# Patient Record
Sex: Female | Born: 1965 | Race: Black or African American | Hispanic: No | Marital: Single | Smoking: Former smoker
Health system: Southern US, Community
[De-identification: ages and names within clinical notes are randomized; demographics above are authoritative.]

## PROBLEM LIST (undated history)

## (undated) DIAGNOSIS — H543 Unqualified visual loss, both eyes: Secondary | ICD-10-CM

## (undated) DIAGNOSIS — J45909 Unspecified asthma, uncomplicated: Secondary | ICD-10-CM

## (undated) DIAGNOSIS — E119 Type 2 diabetes mellitus without complications: Secondary | ICD-10-CM

## (undated) DIAGNOSIS — N289 Disorder of kidney and ureter, unspecified: Secondary | ICD-10-CM

## (undated) HISTORY — PX: AV FISTULA PLACEMENT: SHX1204

## (undated) HISTORY — PX: OTHER SURGICAL HISTORY: SHX169

---

## 2013-12-30 DIAGNOSIS — I10 Essential (primary) hypertension: Secondary | ICD-10-CM | POA: Diagnosis present

## 2013-12-31 DIAGNOSIS — E1149 Type 2 diabetes mellitus with other diabetic neurological complication: Secondary | ICD-10-CM | POA: Diagnosis present

## 2015-01-25 DIAGNOSIS — E782 Mixed hyperlipidemia: Secondary | ICD-10-CM | POA: Insufficient documentation

## 2016-01-08 DIAGNOSIS — I5022 Chronic systolic (congestive) heart failure: Secondary | ICD-10-CM | POA: Diagnosis present

## 2016-01-08 DIAGNOSIS — E1139 Type 2 diabetes mellitus with other diabetic ophthalmic complication: Secondary | ICD-10-CM | POA: Diagnosis present

## 2016-12-04 DIAGNOSIS — I447 Left bundle-branch block, unspecified: Secondary | ICD-10-CM | POA: Insufficient documentation

## 2017-01-13 DIAGNOSIS — D689 Coagulation defect, unspecified: Secondary | ICD-10-CM | POA: Insufficient documentation

## 2017-12-02 DIAGNOSIS — N186 End stage renal disease: Secondary | ICD-10-CM

## 2017-12-02 DIAGNOSIS — Z992 Dependence on renal dialysis: Secondary | ICD-10-CM

## 2018-02-10 DIAGNOSIS — Z6841 Body Mass Index (BMI) 40.0 and over, adult: Secondary | ICD-10-CM | POA: Insufficient documentation

## 2018-04-29 DIAGNOSIS — E875 Hyperkalemia: Secondary | ICD-10-CM | POA: Insufficient documentation

## 2018-10-28 DIAGNOSIS — Z86718 Personal history of other venous thrombosis and embolism: Secondary | ICD-10-CM

## 2020-10-15 ENCOUNTER — Encounter (HOSPITAL_COMMUNITY): Payer: Self-pay | Admitting: Emergency Medicine

## 2020-10-15 ENCOUNTER — Other Ambulatory Visit: Payer: Self-pay

## 2020-10-15 ENCOUNTER — Inpatient Hospital Stay (HOSPITAL_COMMUNITY)
Admission: EM | Admit: 2020-10-15 | Discharge: 2020-10-19 | DRG: 640 | Disposition: A | Discharge status: Home / Self Care | Payer: Medicare HMO | Attending: Family Medicine | Admitting: Family Medicine

## 2020-10-15 ENCOUNTER — Emergency Department (HOSPITAL_COMMUNITY): Payer: Medicare HMO

## 2020-10-15 DIAGNOSIS — R079 Chest pain, unspecified: Secondary | ICD-10-CM | POA: Diagnosis not present

## 2020-10-15 DIAGNOSIS — Z91041 Radiographic dye allergy status: Secondary | ICD-10-CM

## 2020-10-15 DIAGNOSIS — E785 Hyperlipidemia, unspecified: Secondary | ICD-10-CM | POA: Diagnosis present

## 2020-10-15 DIAGNOSIS — E1122 Type 2 diabetes mellitus with diabetic chronic kidney disease: Secondary | ICD-10-CM | POA: Diagnosis present

## 2020-10-15 DIAGNOSIS — Z86718 Personal history of other venous thrombosis and embolism: Secondary | ICD-10-CM | POA: Diagnosis not present

## 2020-10-15 DIAGNOSIS — J45909 Unspecified asthma, uncomplicated: Secondary | ICD-10-CM | POA: Diagnosis present

## 2020-10-15 DIAGNOSIS — I132 Hypertensive heart and chronic kidney disease with heart failure and with stage 5 chronic kidney disease, or end stage renal disease: Secondary | ICD-10-CM | POA: Diagnosis present

## 2020-10-15 DIAGNOSIS — Z20822 Contact with and (suspected) exposure to covid-19: Secondary | ICD-10-CM | POA: Diagnosis present

## 2020-10-15 DIAGNOSIS — E875 Hyperkalemia: Secondary | ICD-10-CM | POA: Diagnosis present

## 2020-10-15 DIAGNOSIS — E1165 Type 2 diabetes mellitus with hyperglycemia: Secondary | ICD-10-CM | POA: Diagnosis present

## 2020-10-15 DIAGNOSIS — N186 End stage renal disease: Secondary | ICD-10-CM | POA: Diagnosis present

## 2020-10-15 DIAGNOSIS — E1149 Type 2 diabetes mellitus with other diabetic neurological complication: Secondary | ICD-10-CM | POA: Diagnosis present

## 2020-10-15 DIAGNOSIS — Z7901 Long term (current) use of anticoagulants: Secondary | ICD-10-CM

## 2020-10-15 DIAGNOSIS — Z79899 Other long term (current) drug therapy: Secondary | ICD-10-CM

## 2020-10-15 DIAGNOSIS — G9341 Metabolic encephalopathy: Secondary | ICD-10-CM | POA: Diagnosis not present

## 2020-10-15 DIAGNOSIS — E1151 Type 2 diabetes mellitus with diabetic peripheral angiopathy without gangrene: Secondary | ICD-10-CM | POA: Diagnosis present

## 2020-10-15 DIAGNOSIS — E8770 Fluid overload, unspecified: Principal | ICD-10-CM | POA: Diagnosis present

## 2020-10-15 DIAGNOSIS — R0789 Other chest pain: Secondary | ICD-10-CM | POA: Diagnosis not present

## 2020-10-15 DIAGNOSIS — Z992 Dependence on renal dialysis: Secondary | ICD-10-CM | POA: Diagnosis not present

## 2020-10-15 DIAGNOSIS — Z888 Allergy status to other drugs, medicaments and biological substances status: Secondary | ICD-10-CM

## 2020-10-15 DIAGNOSIS — Z6841 Body Mass Index (BMI) 40.0 and over, adult: Secondary | ICD-10-CM | POA: Diagnosis not present

## 2020-10-15 DIAGNOSIS — Z91013 Allergy to seafood: Secondary | ICD-10-CM

## 2020-10-15 DIAGNOSIS — D631 Anemia in chronic kidney disease: Secondary | ICD-10-CM | POA: Diagnosis present

## 2020-10-15 DIAGNOSIS — Z9115 Patient's noncompliance with renal dialysis: Secondary | ICD-10-CM | POA: Diagnosis not present

## 2020-10-15 DIAGNOSIS — Z87891 Personal history of nicotine dependence: Secondary | ICD-10-CM

## 2020-10-15 DIAGNOSIS — E1139 Type 2 diabetes mellitus with other diabetic ophthalmic complication: Secondary | ICD-10-CM | POA: Diagnosis present

## 2020-10-15 DIAGNOSIS — Z794 Long term (current) use of insulin: Secondary | ICD-10-CM

## 2020-10-15 DIAGNOSIS — I5022 Chronic systolic (congestive) heart failure: Secondary | ICD-10-CM | POA: Diagnosis present

## 2020-10-15 DIAGNOSIS — H547 Unspecified visual loss: Secondary | ICD-10-CM | POA: Diagnosis present

## 2020-10-15 DIAGNOSIS — I953 Hypotension of hemodialysis: Secondary | ICD-10-CM | POA: Diagnosis present

## 2020-10-15 DIAGNOSIS — I10 Essential (primary) hypertension: Secondary | ICD-10-CM | POA: Diagnosis present

## 2020-10-15 DIAGNOSIS — Z89511 Acquired absence of right leg below knee: Secondary | ICD-10-CM | POA: Diagnosis not present

## 2020-10-15 DIAGNOSIS — I447 Left bundle-branch block, unspecified: Secondary | ICD-10-CM | POA: Diagnosis present

## 2020-10-15 DIAGNOSIS — I639 Cerebral infarction, unspecified: Secondary | ICD-10-CM | POA: Insufficient documentation

## 2020-10-15 HISTORY — DX: Type 2 diabetes mellitus without complications: E11.9

## 2020-10-15 HISTORY — DX: Unqualified visual loss, both eyes: H54.3

## 2020-10-15 HISTORY — DX: Disorder of kidney and ureter, unspecified: N28.9

## 2020-10-15 HISTORY — DX: Unspecified asthma, uncomplicated: J45.909

## 2020-10-15 LAB — BASIC METABOLIC PANEL
Anion gap: 15 (ref 5–15)
BUN: 103 mg/dL — ABNORMAL HIGH (ref 6–20)
CO2: 24 mmol/L (ref 22–32)
Calcium: 8.8 mg/dL — ABNORMAL LOW (ref 8.9–10.3)
Chloride: 94 mmol/L — ABNORMAL LOW (ref 98–111)
Creatinine, Ser: 12.76 mg/dL — ABNORMAL HIGH (ref 0.44–1.00)
GFR, Estimated: 3 mL/min — ABNORMAL LOW (ref >60)
Glucose, Bld: 287 mg/dL — ABNORMAL HIGH (ref 70–99)
Potassium: 6 mmol/L — ABNORMAL HIGH (ref 3.5–5.1)
Sodium: 133 mmol/L — ABNORMAL LOW (ref 135–145)

## 2020-10-15 LAB — CBC
HCT: 29.6 % — ABNORMAL LOW (ref 36.0–46.0)
Hemoglobin: 10 g/dL — ABNORMAL LOW (ref 12.0–15.0)
MCH: 29.9 pg (ref 26.0–34.0)
MCHC: 33.8 g/dL (ref 30.0–36.0)
MCV: 88.4 fL (ref 80.0–100.0)
Platelets: 257 10*3/uL (ref 150–400)
RBC: 3.35 MIL/uL — ABNORMAL LOW (ref 3.87–5.11)
RDW: 15.2 % (ref 11.5–15.5)
WBC: 9.7 10*3/uL (ref 4.0–10.5)
nRBC: 0 % (ref 0.0–0.2)

## 2020-10-15 LAB — PROTIME-INR
INR: 1.6 — ABNORMAL HIGH (ref 0.8–1.2)
Prothrombin Time: 19.1 seconds — ABNORMAL HIGH (ref 11.4–15.2)

## 2020-10-15 LAB — GLUCOSE, CAPILLARY: Glucose-Capillary: 218 mg/dL — ABNORMAL HIGH (ref 70–99)

## 2020-10-15 LAB — TROPONIN I (HIGH SENSITIVITY)
Troponin I (High Sensitivity): 18 ng/L — ABNORMAL HIGH (ref <18)
Troponin I (High Sensitivity): 18 ng/L — ABNORMAL HIGH (ref <18)

## 2020-10-15 LAB — RESP PANEL BY RT-PCR (FLU A&B, COVID) ARPGX2
Influenza A by PCR: NEGATIVE
Influenza B by PCR: NEGATIVE
SARS Coronavirus 2 by RT PCR: NEGATIVE

## 2020-10-15 MED ORDER — WARFARIN SODIUM 7.5 MG PO TABS: 3.5000 mg | ORAL_TABLET | Freq: Every day | ORAL | Status: DC

## 2020-10-15 MED ORDER — WARFARIN - PHARMACIST DOSING INPATIENT: Freq: Every day | Status: DC

## 2020-10-15 MED ORDER — WARFARIN SODIUM 7.5 MG PO TABS: 7.5000 mg | ORAL_TABLET | Freq: Once | ORAL | Status: DC

## 2020-10-15 MED ORDER — ALBUTEROL SULFATE (2.5 MG/3ML) 0.083% IN NEBU: 3.0000 mL | INHALATION_SOLUTION | RESPIRATORY_TRACT | Status: DC | PRN

## 2020-10-15 MED ORDER — WARFARIN SODIUM 7.5 MG PO TABS
7.5000 mg | ORAL_TABLET | Freq: Once | ORAL | Status: AC
  Administered 2020-10-15: 7.5 mg via ORAL
  Filled 2020-10-15: qty 1

## 2020-10-15 MED ORDER — ONDANSETRON HCL 4 MG PO TABS: 4.0000 mg | ORAL_TABLET | Freq: Four times a day (QID) | ORAL | Status: DC | PRN

## 2020-10-15 MED ORDER — INSULIN ASPART 100 UNIT/ML IJ SOLN
0.0000 [IU] | Freq: Every day | INTRAMUSCULAR | Status: DC
  Administered 2020-10-15 – 2020-10-17 (×2): 2 [IU] via SUBCUTANEOUS

## 2020-10-15 MED ORDER — INSULIN DETEMIR 100 UNIT/ML ~~LOC~~ SOLN
14.0000 [IU] | Freq: Every day | SUBCUTANEOUS | Status: DC
  Administered 2020-10-15: 14 [IU] via SUBCUTANEOUS
  Filled 2020-10-15 (×2): qty 0.14

## 2020-10-15 MED ORDER — ONDANSETRON HCL 4 MG/2ML IJ SOLN: 4.0000 mg | Freq: Four times a day (QID) | INTRAMUSCULAR | Status: DC | PRN

## 2020-10-15 MED ORDER — SODIUM ZIRCONIUM CYCLOSILICATE 5 G PO PACK
10.0000 g | PACK | Freq: Once | ORAL | Status: AC
  Administered 2020-10-15: 10 g via ORAL
  Filled 2020-10-15: qty 2

## 2020-10-15 MED ORDER — CHLORHEXIDINE GLUCONATE CLOTH 2 % EX PADS
6.0000 | MEDICATED_PAD | Freq: Every day | CUTANEOUS | Status: DC
  Administered 2020-10-16 – 2020-10-18 (×2): 6 via TOPICAL

## 2020-10-15 MED ORDER — PANTOPRAZOLE SODIUM 40 MG PO TBEC
40.0000 mg | DELAYED_RELEASE_TABLET | Freq: Every day | ORAL | Status: DC
  Administered 2020-10-15 – 2020-10-18 (×4): 40 mg via ORAL
  Filled 2020-10-15 (×4): qty 1

## 2020-10-15 MED ORDER — INSULIN ASPART 100 UNIT/ML IJ SOLN: 8.0000 [IU] | Freq: Three times a day (TID) | INTRAMUSCULAR | Status: DC

## 2020-10-15 MED ORDER — ROSUVASTATIN CALCIUM 20 MG PO TABS
20.0000 mg | ORAL_TABLET | ORAL | Status: DC
  Administered 2020-10-16: 20 mg via ORAL
  Filled 2020-10-15 (×2): qty 1

## 2020-10-15 MED ORDER — INSULIN ASPART 100 UNIT/ML IJ SOLN
0.0000 [IU] | Freq: Three times a day (TID) | INTRAMUSCULAR | Status: DC
  Administered 2020-10-16: 3 [IU] via SUBCUTANEOUS
  Administered 2020-10-16: 8 [IU] via SUBCUTANEOUS
  Administered 2020-10-17 (×2): 2 [IU] via SUBCUTANEOUS
  Administered 2020-10-17: 3 [IU] via SUBCUTANEOUS
  Administered 2020-10-18: 2 [IU] via SUBCUTANEOUS
  Administered 2020-10-18 (×2): 3 [IU] via SUBCUTANEOUS

## 2020-10-15 MED ORDER — CALCIUM GLUCONATE-NACL 1-0.675 GM/50ML-% IV SOLN
1.0000 g | Freq: Once | INTRAVENOUS | Status: AC
  Administered 2020-10-15: 1000 mg via INTRAVENOUS
  Filled 2020-10-15: qty 50

## 2020-10-15 MED ORDER — SEVELAMER CARBONATE 800 MG PO TABS
3200.0000 mg | ORAL_TABLET | Freq: Three times a day (TID) | ORAL | Status: DC
  Administered 2020-10-17 – 2020-10-18 (×5): 3200 mg via ORAL
  Filled 2020-10-15 (×6): qty 4

## 2020-10-15 NOTE — Progress Notes (Signed)
ANTICOAGULATION CONSULT NOTE - Initial Up Consult   Pharmacy Consult for warfarin dosing  Indication: VTE prophylaxis    Allergies  Allergen Reactions   Eggs Or Egg-Derived Products Hives   Iodinated Diagnostic Agents Hives, Shortness Of Breath and Nausea And Vomiting    IVP   Shellfish Allergy Hives and Nausea And Vomiting    "LOBSTER ONLY" Lobster    Dulaglutide Nausea And Vomiting   Gabapentin Other (See Comments)    Other reaction(s): Delirium    Lorazepam Other (See Comments)    Other reaction(s): Hallucination       Patient Measurements: Last Weight  Most recent update: 10/15/2020  7:05 PM    Weight  118.1 kg (260 lb 5.8 oz)            Body mass index is 44.69 kg/m. March Rummage               Temp: 98.1 F (36.7 C) (07/03 1850) Temp Source: Oral (07/03 1850) BP: 134/84 (07/03 1850) Pulse Rate: 100 (07/03 1850)  Labs: Recent Labs    10/15/20 1510  HGB 10.0*  HCT 29.6*  PLT 257  LABPROT 19.1*  INR 1.6*  CREATININE 12.76*    Estimated Creatinine Clearance: 6.2 mL/min (A) (by C-G formula based on SCr of 12.76 mg/dL (H)).     Medications:  Medications Prior to Admission  Medication Sig Dispense Refill Last Dose   albuterol (PROVENTIL) (5 MG/ML) 0.5% nebulizer solution Take 2.5 mg by nebulization every 6 (six) hours as needed for wheezing or shortness of breath.   PRN   albuterol (VENTOLIN HFA) 108 (90 Base) MCG/ACT inhaler Inhale 2 puffs into the lungs every 4 (four) hours as needed for wheezing or shortness of breath.   10/15/2020   Cholecalciferol 25 MCG (1000 UT) tablet Take 1,000 Units by mouth daily. Takes on Tues, Thurs,Sat   10/10/2020   insulin aspart (NOVOLOG) 100 UNIT/ML injection Inject 8 Units into the skin 3 (three) times daily before meals.   10/14/2020   insulin detemir (LEVEMIR) 100 UNIT/ML injection Inject 14 Units into the skin at bedtime.   10/14/2020   lidocaine-prilocaine (EMLA) cream Apply 1 application topically daily. Takes on Tues,  Thurs, Sat   10/10/2020   pantoprazole (PROTONIX) 40 MG tablet Take 40 mg by mouth daily.   10/14/2020   rosuvastatin (CRESTOR) 20 MG tablet Take 20 mg by mouth every 7 (seven) days.   Past Week   sevelamer carbonate (RENVELA) 800 MG tablet Take 4 tablets by mouth 3 (three) times daily.   10/14/2020   warfarin (COUMADIN) 7.5 MG tablet Take 3.5-7.5 mg by mouth daily. 3.5 mg daily T,R,S,S 7.5 mg daily M,W,F   10/14/2020 at 1000   Scheduled:   [START ON 10/16/2020] Chlorhexidine Gluconate Cloth  6 each Topical Q0600   insulin aspart  0-15 Units Subcutaneous TID WC   insulin aspart  0-5 Units Subcutaneous QHS   insulin aspart  8 Units Subcutaneous TID AC   insulin detemir  14 Units Subcutaneous QHS   pantoprazole  40 mg Oral Daily   [START ON 10/16/2020] rosuvastatin  20 mg Oral Q7 days   sevelamer carbonate  3,200 mg Oral TID with meals   warfarin  3.75-7.5 mg Oral Daily   Infusions:   calcium gluconate     PRN: albuterol, ondansetron **OR** ondansetron (ZOFRAN) IV Anti-infectives (From admission, onward)    None       Goal of Therapy:  INR 2-3 Monitor platelets by  anticoagulation protocol: Yes    Prior to Admission Warfarin Dosing:  Hermoine Frevert takes 7.'5mg'$  of warfarin MWF and 3.'5mg'$  qTuThSaSun     Admit INR was 1.6 Lab Results  Component Value Date   INR 1.6 (H) 10/15/2020    Assessment: Stacey Harrell a 56 y.o. female requires anticoagulation with warfarin for the indication of  VTE prophylaxis. Warfarin will be initiated inpatient following pharmacy protocol per pharmacy consult. Patient most recent blood work is as follows: CBC Latest Ref Rng & Units 10/15/2020  WBC 4.0 - 10.5 K/uL 9.7  Hemoglobin 12.0 - 15.0 g/dL 10.0(L)  Hematocrit 36.0 - 46.0 % 29.6(L)  Platelets 150 - 400 K/uL 257     Plan: Warfarin 7.'5mg'$  po x 1  Monitor CBC daily with am labs   Monitor INR daily Monitor for signs and symptoms of bleeding   Donna Christen Heyden Jaber, PharmD, MBA, BCGP Clinical  Pharmacist

## 2020-10-15 NOTE — Progress Notes (Signed)
Pt arrived to room 325 via stretcher from ED. Pt transferred from stretcher to bed with 3 assists.  Pt states only makes small amount of urine infrequently, states will use bedpan when she needs to void. Call bell within reach, boyfriend at bedside. Tele on, SR 90's. Other VSS. O2 @ 2 lpm Sharon on.  Oriented to safety procedures, states understanding.

## 2020-10-15 NOTE — ED Provider Notes (Signed)
[facility] EMERGENCY DEPARTMENT Provider Note   CSN: [id number] Arrival date & time: 10/15/20  1427     History Chief Complaint  Patient presents with   Chest Pain    Stacey Harrell is a 55 y.o. female.  She has a history of end-stage renal disease and last had dialysis on 6/28.  Also has a history of hypertension, CHF, peripheral vascular disease, DM. she has moved to [place] but has not establish care with [facility] yet.  She is done the paperwork and had to get a TB test but has not heard back from them.  Complaining of chest tightness and shortness of breath.  Symptoms typical for her when she has missed dialysis.  She denies any abdominal pain vomiting or diarrhea.  The history is provided by the patient.  Shortness of Breath Severity:  Moderate Onset quality:  Gradual Duration:  5 days Timing:  Constant Progression:  Worsening Chronicity:  Recurrent Context: activity   Relieved by:  Nothing Worsened by:  Activity Ineffective treatments:  None tried Associated symptoms: chest pain   Associated symptoms: no abdominal pain, no cough, no fever, no headaches, no neck pain, no rash, no sore throat and no vomiting       Past Medical History:  Diagnosis Date   Asthma    Blindness, bilateral    Diabetes mellitus without complication ([place])    Renal disorder     There are no problems to display for this patient.   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     History reviewed. No pertinent family history.  Social History   Tobacco Use   Smoking status: Former    Years: 40.00    Pack years: 0.00    Types: Cigarettes   Smokeless tobacco: Never  Vaping Use   Vaping Use: Never used  Substance Use Topics   Alcohol use: Not Currently   Drug use: Not Currently    Home Medications Prior to Admission medications   Not on File     Allergies    Iodinated diagnostic agents, Other, Shellfish allergy, Dulaglutide, Eggs or egg-derived products, Gabapentin, and Lorazepam  Review of Systems   Review of Systems  Constitutional:  Negative for fever.  HENT:  Negative for sore throat.   Eyes:  Positive for visual disturbance (chronic).  Respiratory:  Positive for shortness of breath. Negative for cough.   Cardiovascular:  Positive for chest pain.  Gastrointestinal:  Negative for abdominal pain and vomiting.  Genitourinary:  Negative for vaginal bleeding.  Musculoskeletal:  Negative for neck pain.  Skin:  Negative for rash.  Neurological:  Negative for headaches.   Physical Exam Updated Vital Signs BP (!) 145/83   Pulse (!) 105   Temp 98.4 F (36.9 C) (Oral)   Resp 18   Ht '5\' 4"'$  (1.626 m)   Wt 115.2 kg   SpO2 93%   BMI 43.60 kg/m   Physical Exam Vitals and nursing note reviewed.  Constitutional:      General: She is not in acute distress.    Appearance: Normal appearance. She is well-developed. She is obese.  HENT:     Head: Normocephalic and atraumatic.  Eyes:     Conjunctiva/sclera: Conjunctivae normal.  Cardiovascular:     Rate and Rhythm: Regular rhythm. Tachycardia  present.     Heart sounds: No murmur heard. Pulmonary:     Effort: Pulmonary effort is normal. No respiratory distress.     Breath sounds: Normal breath sounds. No stridor. No wheezing.  Abdominal:     Palpations: Abdomen is soft.     Tenderness: There is no abdominal tenderness.  Musculoskeletal:        General: No tenderness. Normal range of motion.     Cervical back: Neck supple.     Comments: She is to the right upper arm.  She has a BKA lower extremity  Skin:    General: Skin is warm and dry.  Neurological:     General: No focal deficit present.     Mental Status: She is alert.     GCS: GCS eye subscore is 4. GCS verbal subscore is 5. GCS motor subscore is 6.    ED Results / Procedures / Treatments   Labs (all labs  ordered are listed, but only abnormal results are displayed) Labs Reviewed  BASIC METABOLIC PANEL - Abnormal; Notable for the following components:      Result Value   Sodium 133 (*)    Potassium 6.0 (*)    Chloride 94 (*)    Glucose, Bld 287 (*)    BUN 103 (*)    Creatinine, Ser 12.76 (*)    Calcium 8.8 (*)    GFR, Estimated 3 (*)    All other components within normal limits  CBC - Abnormal; Notable for the following components:   RBC 3.35 (*)    Hemoglobin 10.0 (*)    HCT 29.6 (*)    All other components within normal limits  PROTIME-INR - Abnormal; Notable for the following components:   Prothrombin Time 19.1 (*)    INR 1.6 (*)    All other components within normal limits  TROPONIN I (HIGH SENSITIVITY) - Abnormal; Notable for the following components:   Troponin I (High Sensitivity) 18 (*)    All other components within normal limits  RESP PANEL BY RT-PCR (FLU A&B, COVID) [id number]    EKG EKG Interpretation  Date/Time:  Sunday October 15 2020 15:06:31 EDT Ventricular Rate:  102 PR Interval:  152 QRS Duration: 148 QT Interval:  422 QTC Calculation: 550 R Axis:   107 Text Interpretation: Sinus tachycardia Rightward axis Non-specific intra-ventricular conduction block Abnormal ECG No old tracing to compare Confirmed by Aletta Edouard [phone]) on 10/15/2020 3:26:14 PM  Radiology DG Chest 2 View  Result Date: 10/15/2020 CLINICAL DATA:  Chest pain and shortness of breath. EXAM: CHEST - 2 VIEW COMPARISON:  None. FINDINGS: The heart is enlarged. There is a vascular stent to the right of the mediastinum. Surgical clips adjacent to the right lung apex. Peribronchial thickening may be bronchitic or congestive minimal fluid in the fissures without significant sub pulmonic effusion. No confluent consolidation. No pneumothorax. No acute osseous abnormalities are seen. IMPRESSION: 1. Cardiomegaly. 2. Peribronchial thickening may be bronchitic or congestive. Fluid in the fissures. Findings  suggest fluid overload. Electronically Signed   By: Keith Rake M.D.   On: 10/15/2020 15:37    Procedures .Critical Care  Date/Time: 10/16/2020 10:10 AM Performed by: Hayden Rasmussen, MD Authorized by: Hayden Rasmussen, MD   Critical care provider statement:    Critical care time (minutes):  45   Critical care time was exclusive of:  Separately billable procedures and treating other patients   Critical care was necessary to treat or prevent imminent or life-threatening  deterioration of the following conditions:  Metabolic crisis   Critical care was time spent personally by me on the following activities:  Discussions with consultants, evaluation of patient's response to treatment, examination of patient, ordering and performing treatments and interventions, ordering and review of laboratory studies, ordering and review of radiographic studies, pulse oximetry, re-evaluation of patient's condition, obtaining history from patient or surrogate, review of old charts and development of treatment plan with patient or surrogate   Medications Ordered in ED Medications  Chlorhexidine Gluconate Cloth 2 % PADS 6 each (6 each Topical Given 10/16/20 0601)  rosuvastatin (CRESTOR) tablet 20 mg (has no administration in time range)  insulin detemir (LEVEMIR) injection 14 Units (14 Units Subcutaneous Given 10/15/20 2111)  pantoprazole (PROTONIX) EC tablet 40 mg (40 mg Oral Given 10/16/20 0805)  sevelamer carbonate (RENVELA) tablet 3,200 mg (3,200 mg Oral Not Given 10/16/20 0805)  albuterol (PROVENTIL) (2.5 MG/3ML) 0.083% nebulizer solution 3 mL (has no administration in time range)  insulin aspart (novoLOG) injection 0-15 Units (3 Units Subcutaneous Given 10/16/20 0806)  insulin aspart (novoLOG) injection 0-5 Units (2 Units Subcutaneous Given 10/15/20 2114)  ondansetron (ZOFRAN) tablet 4 mg (has no administration in time range)    Or  ondansetron (ZOFRAN) injection 4 mg (has no administration in time range)   Warfarin - Pharmacist Dosing Inpatient ( Does not apply Given 10/15/20 2239)  warfarin (COUMADIN) tablet 7.5 mg (has no administration in time range)  sodium zirconium cyclosilicate (LOKELMA) packet 10 g (10 g Oral Given 10/15/20 1733)  calcium gluconate 1 g/ 50 mL sodium chloride IVPB (0 mg Intravenous Stopped 10/15/20 2150)  warfarin (COUMADIN) tablet 7.5 mg (7.5 mg Oral Given 10/15/20 2239)    ED Course  I have reviewed the triage vital signs and the nursing notes.  Pertinent labs & imaging results that were available during my care of the patient were reviewed by me and considered in my medical decision making (see chart for details).  Clinical Course as of 10/16/20 1007  Sun Oct 15, 2020  1638 Discussed with nephrology Dr. Delena Bali.  He is recommending hospitalist admission, Lokelma, renal diet and will arrange for her dialysis tomorrow. [MB]  1638 Discussed with Dr. Nehemiah Settle from [facility] hospitalist who will evaluate the patient for admission. [MB]    Clinical Course User Index [MB] Hayden Rasmussen, MD   MDM Rules/Calculators/A&P                         This patient complains of chest pain and shortness of breath, needing dialysis; this involves an extensive number of treatment Options and is a complaint that carries with it a high risk of complications and Morbidity. The differential includes CHF, fluid overload, metabolic derangement, hyperkalemia, arrhythmia, ACS  I ordered, reviewed and interpreted labs, which included CBC with normal white count, hemoglobin low with no priors to compare with, chemistry showing elevated BUN/creatinine consistent with her renal failure, elevated potassium, troponins mildly elevated will need to be trended, INR subtherapeutic I ordered medication oral Lokelma for hyperkalemia I ordered imaging studies which included chest x-ray and I independently    visualized and interpreted imaging which showed cardiomegaly and some signs of fluid overload Additional  history obtained from patient significant other Previous records obtained and reviewed in epic, no recent visits in our system I consulted Dr. Jonnie Finner nephrology and Dr. Nehemiah Settle [facility] and discussed lab and imaging findings  Critical Interventions: Work-up and management of patient's end-stage renal  disease with hyperkalemia with interventions of lokelma  After the interventions stated above, I reevaluated the patient and found patient to be fairly asymptomatic.  She is in agreement for admission to the hospital for further management and dialysis in the morning   Final Clinical Impression(s) / ED Diagnoses Final diagnoses:  Hyperkalemia  ESRD on dialysis [facility])  Nonspecific chest pain    Rx / DC Orders ED Discharge Orders     None        Hayden Rasmussen, MD 10/16/20 1013

## 2020-10-15 NOTE — ED Triage Notes (Signed)
Pt to the ED asking for emergent dialysis.  Pt recently moved from [place] to [place] and has not been set up with [facility] yet.  Pt last had dialysis on Tuesday 10/10/20.  Pt c/o chest tightness with shortness of breath that began today.

## 2020-10-15 NOTE — H&P (Addendum)
History and Physical  Stacey Harrell [id number] DOB: [birth year] DOA: 10/15/2020  Referring physician: Dr Melina Copa, ED physician PCP: Pcp, No  Outpatient Specialists:   Patient Coming From: home  Chief Complaint: shortness of breath, chest pressure  HPI: Stacey Harrell is a 55 y.o. 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 She has ESRD on dialysis. She moved to [place], but does not have a nephrologist there and has not had dialysis for 3 days. She started having chest pressure and shortness of breath this morning.  Worse with movement and improved with rest.  No radiation of chest pressure.  Symptoms are staying the same.  Emergency Department Course: Potassium 6.0.  Troponins 18 and stable.  White count normal.  Slightly anemic at 10.0.  INR 1.6.  COVID-negative.  EDP discussed with nephrology: No increased oxygen demand and patient will get dialysis tomorrow.  Dose of Lokelma was given.  Review of Systems:   Pt denies any fevers, chills, nausea, vomiting, diarrhea, constipation, abdominal pain, palpitations, headache, vision changes, lightheadedness, dizziness, melena, rectal bleeding.  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 Her smoking use included cigarettes. She has never used smokeless tobacco. She reports previous alcohol use. She reports previous drug use. Patient lives at home  Allergies  Allergen Reactions   Eggs Or Egg-Derived Products Hives   Iodinated Diagnostic Agents Hives, Shortness Of Breath and Nausea And Vomiting    IVP   Shellfish Allergy Hives and Nausea  And Vomiting    "LOBSTER ONLY" Lobster    Dulaglutide Nausea And Vomiting   Gabapentin Other (See Comments)    Other reaction(s): Delirium    Lorazepam Other (See Comments)    Other reaction(s): Hallucination     History reviewed. No pertinent family history.  Family history of diabetes  Prior to Admission medications   Medication Sig Start Date End Date Taking? Authorizing Provider  albuterol (PROVENTIL) (5 MG/ML) 0.5% nebulizer solution Take 2.5 mg by nebulization every 6 (six) hours as needed for wheezing or shortness of breath.   Yes [provider]  albuterol (VENTOLIN HFA) 108 (90 Base) MCG/ACT inhaler Inhale 2 puffs into the lungs every 4 (four) hours as needed for wheezing or shortness of breath.   Yes [provider]  Cholecalciferol 25 MCG (1000 UT) tablet Take 1,000 Units by mouth daily. Takes on Tues, Thurs,Sat   Yes [provider]  insulin aspart (NOVOLOG) 100 UNIT/ML injection Inject 8 Units into the skin 3 (three) times daily before meals.   Yes [provider]  insulin detemir (LEVEMIR) 100 UNIT/ML injection Inject 14 Units into the skin at bedtime.   Yes [provider]  lidocaine-prilocaine (EMLA) cream Apply 1 application topically daily. Takes on Tues, Thurs, Sat   Yes [provider]  pantoprazole (PROTONIX) 40 MG tablet Take 40 mg by mouth daily. 08/24/20  Yes [provider]  rosuvastatin (CRESTOR) 20 MG tablet Take 20 mg by mouth every 7 (seven) days. 07/08/19  Yes [provider]  sevelamer carbonate (RENVELA) 800 MG  tablet Take 4 tablets by mouth 3 (three) times daily. 09/25/20  Yes [provider]  warfarin (COUMADIN) 7.5 MG tablet Take 3.5-7.5 mg by mouth daily. 3.5 mg daily T,R,S,S 7.5 mg daily M,W,F   Yes [provider]    Physical Exam: BP 140/89   Pulse 99   Temp 98.4 F (36.9 C) (Oral)   Resp (!) 22   Ht '5\' 4"'$  (1.626 m)   Wt 115.2 kg   SpO2 99%   BMI 43.60  kg/m   General: Middle-age female. Awake and alert and oriented x3. No acute cardiopulmonary distress.  HEENT: Normocephalic atraumatic.  Right and left ears normal in appearance.  Pupils equal, round, reactive to light. Extraocular muscles are intact. Sclerae anicteric and noninjected.  Moist mucosal membranes. No mucosal lesions.  Neck: Neck supple without lymphadenopathy. No carotid bruits. No masses palpated.  Cardiovascular: Regular rate with normal S1-S2 sounds. No murmurs, rubs, gallops auscultated. No JVD.  Respiratory: Mild rales throughout.  Lungs clear to auscultation bilaterally.  No accessory muscle use. Abdomen: Soft, nontender, nondistended. Active bowel sounds. No masses or hepatosplenomegaly  Skin: No rashes, lesions, or ulcerations.  Dry, warm to touch. 2+ dorsalis pedis and radial pulses. Musculoskeletal: Right BKA.  No calf or leg pain. All major joints not erythematous nontender.  No upper or lower joint deformation.  Good ROM.  No contractures  Psychiatric: Intact judgment and insight. Pleasant and cooperative. Neurologic: No focal neurological deficits. Strength is 5/5 and symmetric in upper and lower extremities.  Cranial nerves II through XII are grossly intact.           Labs on Admission: I have personally reviewed following labs and imaging studies  CBC: Recent Labs  Lab 10/15/20 1510  WBC 9.7  HGB 10.0*  HCT 29.6*  MCV 88.4  PLT 99991111   Basic Metabolic Panel: Recent Labs  Lab 10/15/20 1510  NA 133*  K 6.0*  CL 94*  CO2 24  GLUCOSE 287*  BUN 103*  CREATININE 12.76*  CALCIUM 8.8*   GFR: Estimated Creatinine Clearance: 6.1 mL/min (A) (by C-G formula based on SCr of 12.76 mg/dL (H)). Liver Function Tests: No results for input(s): AST, ALT, ALKPHOS, BILITOT, PROT, ALBUMIN in the last 168 hours. No results for input(s): LIPASE, AMYLASE in the last 168 hours. No results for input(s): AMMONIA in the last 168 hours. Coagulation Profile: Recent Labs   Lab 10/15/20 1510  INR 1.6*   Cardiac Enzymes: No results for input(s): CKTOTAL, CKMB, CKMBINDEX, TROPONINI in the last 168 hours. BNP (last 3 results) No results for input(s): PROBNP in the last 8760 hours. HbA1C: No results for input(s): HGBA1C in the last 72 hours. CBG: No results for input(s): GLUCAP in the last 168 hours. Lipid Profile: No results for input(s): CHOL, HDL, LDLCALC, TRIG, CHOLHDL, LDLDIRECT in the last 72 hours. Thyroid Function Tests: No results for input(s): TSH, T4TOTAL, FREET4, T3FREE, THYROIDAB in the last 72 hours. Anemia Panel: No results for input(s): VITAMINB12, FOLATE, FERRITIN, TIBC, IRON, RETICCTPCT in the last 72 hours. Urine analysis: No results found for: COLORURINE, APPEARANCEUR, LABSPEC, PHURINE, GLUCOSEU, HGBUR, BILIRUBINUR, KETONESUR, PROTEINUR, UROBILINOGEN, NITRITE, LEUKOCYTESUR Sepsis Labs: '@LABRCNTIP'$ (procalcitonin:4,lacticidven:4) ) Recent Results (from the past 240 hour(s))  Resp Panel by RT-PCR (Flu A&B, Covid) Nasopharyngeal Swab     Status: None   Collection Time: 10/15/20  4:22 PM   Specimen: Nasopharyngeal Swab; Nasopharyngeal(NP) swabs in vial transport medium  Result Value Ref Range Status   SARS Coronavirus 2 by  RT PCR NEGATIVE NEGATIVE Final    Comment: (NOTE) SARS-CoV-2 target nucleic acids are NOT DETECTED.  The SARS-CoV-2 RNA is generally detectable in upper respiratory specimens during the acute phase of infection. The lowest concentration of SARS-CoV-2 viral copies this assay can detect is 138 copies/mL. A negative result does not preclude SARS-Cov-2 infection and should not be used as the sole basis for treatment or other patient management decisions. A negative result may occur with  improper specimen collection/handling, submission of specimen other than nasopharyngeal swab, presence of viral mutation(s) within the areas targeted by this assay, and inadequate number of viral copies(<138 copies/mL). A negative  result must be combined with clinical observations, patient history, and epidemiological information. The expected result is Negative.  Fact Sheet for Patients:  EntrepreneurPulse.com.au  Fact Sheet for Healthcare Providers:  IncredibleEmployment.be  This test is no t yet approved or cleared by the [place] FDA and  has been authorized for detection and/or diagnosis of SARS-CoV-2 by FDA under an Emergency Use Authorization (EUA). This EUA will remain  in effect (meaning this test can be used) for the duration of the COVID-19 declaration under Section 564(b)(1) of the Act, 21 U.S.C.section 360bbb-3(b)(1), unless the authorization is terminated  or revoked sooner.       Influenza A by PCR NEGATIVE NEGATIVE Final   Influenza B by PCR NEGATIVE NEGATIVE Final    Comment: (NOTE) The Xpert Xpress SARS-CoV-2/FLU/RSV plus assay is intended as an aid in the diagnosis of influenza from Nasopharyngeal swab specimens and should not be used as a sole basis for treatment. Nasal washings and aspirates are unacceptable for Xpert Xpress SARS-CoV-2/FLU/RSV testing.  Fact Sheet for Patients: EntrepreneurPulse.com.au  Fact Sheet for Healthcare Providers: IncredibleEmployment.be  This test is not yet approved or cleared by the [place] FDA and has been authorized for detection and/or diagnosis of SARS-CoV-2 by FDA under an Emergency Use Authorization (EUA). This EUA will remain in effect (meaning this test can be used) for the duration of the COVID-19 declaration under Section 564(b)(1) of the Act, 21 U.S.C. section 360bbb-3(b)(1), unless the authorization is terminated or revoked.  Performed at [facility], [address] [place], [place] [address]      Radiological Exams on Admission: DG Chest 2 View  Result Date: 10/15/2020 CLINICAL DATA:  Chest pain and shortness of breath. EXAM: CHEST - 2 VIEW COMPARISON:   None. FINDINGS: The heart is enlarged. There is a vascular stent to the right of the mediastinum. Surgical clips adjacent to the right lung apex. Peribronchial thickening may be bronchitic or congestive minimal fluid in the fissures without significant sub pulmonic effusion. No confluent consolidation. No pneumothorax. No acute osseous abnormalities are seen. IMPRESSION: 1. Cardiomegaly. 2. Peribronchial thickening may be bronchitic or congestive. Fluid in the fissures. Findings suggest fluid overload. Electronically Signed   By: Keith Rake M.D.   On: 10/15/2020 15:37    EKG: Independently reviewed.  Sinus tachycardia with right axis deviation.  Intraventricular conduction delay.  No acute ST changes.  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  We also discussed care given  by the ED provider.  Acute hyperkalemia Lokelma given Admit to telemetry Dialysis tomorrow No particular peaked T waves, will give a dose of calcium gluconate  Fluid overload state, end-stage renal disease on hemodialysis Dialysis tomorrow Nephrology consulted  Type 2 diabetes Continue home insulin regimen Sliding scale insulin with CBGs before meals and nightly  Hypertension Continue home regimen  History of DVT Patient on Coumadin   DVT prophylaxis: Coumadin Consultants: nephrology Code Status: Full code Family Communication: Partner in the room Disposition Plan: Patient should be able to return home following improvement   Truett Mainland, DO

## 2020-10-16 DIAGNOSIS — R079 Chest pain, unspecified: Secondary | ICD-10-CM

## 2020-10-16 LAB — CBC
HCT: 31.4 % — ABNORMAL LOW (ref 36.0–46.0)
Hemoglobin: 10.5 g/dL — ABNORMAL LOW (ref 12.0–15.0)
MCH: 29.6 pg (ref 26.0–34.0)
MCHC: 33.4 g/dL (ref 30.0–36.0)
MCV: 88.5 fL (ref 80.0–100.0)
Platelets: 266 10*3/uL (ref 150–400)
RBC: 3.55 MIL/uL — ABNORMAL LOW (ref 3.87–5.11)
RDW: 14.8 % (ref 11.5–15.5)
WBC: 9.3 10*3/uL (ref 4.0–10.5)
nRBC: 0 % (ref 0.0–0.2)

## 2020-10-16 LAB — HEPATITIS B SURFACE ANTIGEN: Hepatitis B Surface Ag: NONREACTIVE

## 2020-10-16 LAB — HIV ANTIBODY (ROUTINE TESTING W REFLEX): HIV Screen 4th Generation wRfx: NONREACTIVE

## 2020-10-16 LAB — GLUCOSE, CAPILLARY
Glucose-Capillary: 184 mg/dL — ABNORMAL HIGH (ref 70–99)
Glucose-Capillary: 269 mg/dL — ABNORMAL HIGH (ref 70–99)
Glucose-Capillary: 104 mg/dL — ABNORMAL HIGH (ref 70–99)
Glucose-Capillary: 186 mg/dL — ABNORMAL HIGH (ref 70–99)

## 2020-10-16 LAB — PROTIME-INR
INR: 1.8 — ABNORMAL HIGH (ref 0.8–1.2)
Prothrombin Time: 21.2 seconds — ABNORMAL HIGH (ref 11.4–15.2)

## 2020-10-16 LAB — HEPATITIS B CORE ANTIBODY, TOTAL: Hep B Core Total Ab: NONREACTIVE

## 2020-10-16 LAB — BASIC METABOLIC PANEL
Anion gap: 16 — ABNORMAL HIGH (ref 5–15)
BUN: 103 mg/dL — ABNORMAL HIGH (ref 6–20)
CO2: 25 mmol/L (ref 22–32)
Calcium: 9 mg/dL (ref 8.9–10.3)
Chloride: 95 mmol/L — ABNORMAL LOW (ref 98–111)
Creatinine, Ser: 13.06 mg/dL — ABNORMAL HIGH (ref 0.44–1.00)
GFR, Estimated: 3 mL/min — ABNORMAL LOW (ref >60)
Glucose, Bld: 205 mg/dL — ABNORMAL HIGH (ref 70–99)
Potassium: 6.3 mmol/L (ref 3.5–5.1)
Sodium: 136 mmol/L (ref 135–145)

## 2020-10-16 MED ORDER — CHLORHEXIDINE GLUCONATE CLOTH 2 % EX PADS
6.0000 | MEDICATED_PAD | Freq: Every day | CUTANEOUS | Status: DC
  Administered 2020-10-16 – 2020-10-18 (×3): 6 via TOPICAL

## 2020-10-16 MED ORDER — WARFARIN SODIUM 7.5 MG PO TABS
7.5000 mg | ORAL_TABLET | Freq: Once | ORAL | Status: AC
  Administered 2020-10-16: 7.5 mg via ORAL
  Filled 2020-10-16: qty 1

## 2020-10-16 MED ORDER — INSULIN DETEMIR 100 UNIT/ML ~~LOC~~ SOLN
8.0000 [IU] | Freq: Every day | SUBCUTANEOUS | Status: DC
  Administered 2020-10-16 – 2020-10-18 (×3): 8 [IU] via SUBCUTANEOUS
  Filled 2020-10-16 (×4): qty 0.08

## 2020-10-16 NOTE — Procedures (Signed)
   HEMODIALYSIS TREATMENT NOTE:  3.5 hour heparin-free HD completed via right upper arm AVF (15g/antegrade).  Skin surrounding the AVF is reddened but there is no tenderness, heat, or inflammation.  Pt states, "everyone asks me about that but it's been that way for a while."    Goal NOT met:  UF was limited by hypotension.  Net UF 1 liter.  All blood was returned and hemostasis was achieved in 15 minutes.  No changes from pre-HD assessment.  For additional HD session tomorrow.  Rockwell Alexandria, RN

## 2020-10-16 NOTE — Plan of Care (Signed)

## 2020-10-16 NOTE — Progress Notes (Signed)
ANTICOAGULATION CONSULT NOTE - Initial Up Consult   Pharmacy Consult for warfarin dosing  Indication: VTE prophylaxis    Allergies  Allergen Reactions   Eggs Or Egg-Derived Products Hives   Iodinated Diagnostic Agents Hives, Shortness Of Breath and Nausea And Vomiting    IVP   Shellfish Allergy Hives and Nausea And Vomiting    "LOBSTER ONLY" Lobster    Dulaglutide Nausea And Vomiting   Gabapentin Other (See Comments)    Other reaction(s): Delirium    Lorazepam Other (See Comments)    Other reaction(s): Hallucination       Patient Measurements: Last Weight  Most recent update: 10/16/2020  8:02 AM    Weight  118.1 kg (260 lb 5.8 oz)            Body mass index is 44.69 kg/m. Stacey Harrell               Temp: 98.6 F (37 C) (07/04 0532) BP: 121/74 (07/04 0532) Pulse Rate: 98 (07/04 0532)  Labs: Recent Labs    10/15/20 1510 10/16/20 0649  HGB 10.0* 10.5*  HCT 29.6* 31.4*  PLT 257 266  LABPROT 19.1* 21.2*  INR 1.6* 1.8*  CREATININE 12.76* 13.06*     Estimated Creatinine Clearance: 6.1 mL/min (A) (by C-G formula based on SCr of 13.06 mg/dL (H)).     Medications:  Medications Prior to Admission  Medication Sig Dispense Refill Last Dose   albuterol (PROVENTIL) (5 MG/ML) 0.5% nebulizer solution Take 2.5 mg by nebulization every 6 (six) hours as needed for wheezing or shortness of breath.   PRN   albuterol (VENTOLIN HFA) 108 (90 Base) MCG/ACT inhaler Inhale 2 puffs into the lungs every 4 (four) hours as needed for wheezing or shortness of breath.   10/15/2020   Cholecalciferol 25 MCG (1000 UT) tablet Take 1,000 Units by mouth daily. Takes on Tues, Thurs,Sat   10/10/2020   insulin aspart (NOVOLOG) 100 UNIT/ML injection Inject 8 Units into the skin 3 (three) times daily before meals.   10/14/2020   insulin detemir (LEVEMIR) 100 UNIT/ML injection Inject 14 Units into the skin at bedtime.   10/14/2020   lidocaine-prilocaine (EMLA) cream Apply 1 application topically  daily. Takes on Tues, Thurs, Sat   10/10/2020   pantoprazole (PROTONIX) 40 MG tablet Take 40 mg by mouth daily.   10/14/2020   rosuvastatin (CRESTOR) 20 MG tablet Take 20 mg by mouth every 7 (seven) days.   Past Week   sevelamer carbonate (RENVELA) 800 MG tablet Take 4 tablets by mouth 3 (three) times daily.   10/14/2020   warfarin (COUMADIN) 7.5 MG tablet Take 3.5-7.5 mg by mouth daily. 3.5 mg daily T,R,S,S 7.5 mg daily M,W,F   10/14/2020 at 1000   Scheduled:   Chlorhexidine Gluconate Cloth  6 each Topical Q0600   insulin aspart  0-15 Units Subcutaneous TID WC   insulin aspart  0-5 Units Subcutaneous QHS   insulin detemir  14 Units Subcutaneous QHS   pantoprazole  40 mg Oral Daily   rosuvastatin  20 mg Oral Q7 days   sevelamer carbonate  3,200 mg Oral TID with meals   warfarin  7.5 mg Oral ONCE-1600   Warfarin - Pharmacist Dosing Inpatient   Does not apply q1600   Infusions:    PRN: albuterol, ondansetron **OR** ondansetron (ZOFRAN) IV Anti-infectives (From admission, onward)    None       Goal of Therapy:  INR 2-3 Monitor platelets by anticoagulation protocol: Yes  Prior to Admission Warfarin Dosing:  Stacey Harrell takes 7.'5mg'$  of warfarin MWF and 3.'5mg'$  qTuThSaSun     Admit INR was 1.6 Lab Results  Component Value Date   INR 1.8 (H) 10/16/2020   INR 1.6 (H) 10/15/2020    Assessment: Stacey Harrell a 55 y.o. female requires anticoagulation with warfarin for the indication of  VTE prophylaxis. Warfarin will be initiated inpatient following pharmacy protocol per pharmacy consult. Patient most recent blood work is as follows:  7/4 INR 1.8, subtherapeutic   CBC Latest Ref Rng & Units 10/16/2020 10/15/2020  WBC 4.0 - 10.5 K/uL 9.3 9.7  Hemoglobin 12.0 - 15.0 g/dL 10.5(L) 10.0(L)  Hematocrit 36.0 - 46.0 % 31.4(L) 29.6(L)  Platelets 150 - 400 K/uL 266 257     Plan: Warfarin 7.'5mg'$  po x 1  Monitor CBC daily with am labs   Monitor INR daily Monitor for signs and symptoms of  bleeding   Donna Christen Stacey Harrell, PharmD, MBA, BCGP Clinical Pharmacist

## 2020-10-16 NOTE — Consult Note (Addendum)
[facility] Nephrology Consult Note: Reason for Consult: To manage dialysis and dialysis related needs Referring Physician: Dr Orson Eva  HPI:  Stacey Harrell is an 55 y.o. female with past medical history of insulin-dependent diabetes, hypertension, peripheral vascular disease a status post right BKA asthma, ESRD on HD TTS who presented with chest tightness and shortness of breath, seen as a consultation for the management of dialysis. Patient receives dialysis in [place] and now moved to [place] without establishing outpatient dialysis.  Her last HD was on 6/28 in [place].  Reportedly she was waiting some paperwork's and lab results to establish her dialysis in [facility].  She started having short of breath, chest tightness, fatigue and overall not feeling well.  She is also becoming more confused.  Her boyfriend is at the bedside.  She has right upper extremity AV fistula for the access. In the ER, her blood pressure was acceptable.  The labs showed sodium 133, potassium 6, BUN 103, creatinine 12.76, hemoglobin 10.  The chest x-ray with cardiomegaly and features consistent with fluid overload.  Nephrology was consulted to arrange dialysis. She received medical management for the hyperkalemia.  Today the labs showed potassium 6.3.  She continues to feel chest tightness, fatigue, short of breath and overall not feeling well.  She is quite sleepy however able to answer.  Unable to obtain detailed review of system.  She was not able to tell me more detail about her dialysis.  Past Medical History:  Diagnosis Date   Asthma    Blindness, bilateral    Diabetes mellitus without complication ([place])    Renal disorder     Past Surgical History:  Procedure Laterality Date   AV FISTULA PLACEMENT Right    left foot two toe amputation     rt bka      History reviewed. No pertinent family history.  Social History:  reports that she has quit smoking. Her smoking use included  cigarettes. She has never used smokeless tobacco. She reports previous alcohol use. She reports previous drug use.  Allergies:  Allergies  Allergen Reactions   Eggs Or Egg-Derived Products Hives   Iodinated Diagnostic Agents Hives, Shortness Of Breath and Nausea And Vomiting    IVP   Shellfish Allergy Hives and Nausea And Vomiting    "LOBSTER ONLY" Lobster    Dulaglutide Nausea And Vomiting   Gabapentin Other (See Comments)    Other reaction(s): Delirium    Lorazepam Other (See Comments)    Other reaction(s): Hallucination     Medications: I have reviewed the patient's current medications.   Results for orders placed or performed during the hospital encounter of 10/15/20 (from the past 48 hour(s))  Basic metabolic panel     Status: Abnormal   Collection Time: 10/15/20  3:10 PM  Result Value Ref Range   Sodium 133 (L) 135 - 145 mmol/L   Potassium 6.0 (H) 3.5 - 5.1 mmol/L    Comment: SPECIMEN FREE OF HEMOLYSIS   Chloride 94 (L) 98 - 111 mmol/L   CO2 24 22 - 32 mmol/L   Glucose, Bld 287 (H) 70 - 99 mg/dL    Comment: Glucose reference range applies only to samples taken after fasting for at least 8 hours.   BUN 103 (H) 6 - 20 mg/dL   Creatinine, Ser 12.76 (H) 0.44 - 1.00 mg/dL   Calcium 8.8 (L) 8.9 - 10.3 mg/dL   GFR, Estimated 3 (L) >60 mL/min    Comment: (NOTE) Calculated using  the CKD-EPI Creatinine Equation (2021)    Anion gap 15 5 - 15    Comment: Performed at [facility], [address] [place], [place] [address]  CBC     Status: Abnormal   Collection Time: 10/15/20  3:10 PM  Result Value Ref Range   WBC 9.7 4.0 - 10.5 K/uL   RBC 3.35 (L) 3.87 - 5.11 MIL/uL   Hemoglobin 10.0 (L) 12.0 - 15.0 g/dL   HCT 29.6 (L) 36.0 - 46.0 %   MCV 88.4 80.0 - 100.0 fL   MCH 29.9 26.0 - 34.0 pg   MCHC 33.8 30.0 - 36.0 g/dL   RDW 15.2 11.5 - 15.5 %   Platelets 257 150 - 400 K/uL   nRBC 0.0 0.0 - 0.2 %    Comment: Performed at [facility], [address] [place], [place]  [address]  Troponin I (High Sensitivity)     Status: Abnormal   Collection Time: 10/15/20  3:10 PM  Result Value Ref Range   Troponin I (High Sensitivity) 18 (H) <18 ng/L    Comment: (NOTE) Elevated high sensitivity troponin I (hsTnI) values and significant  changes across serial measurements may suggest ACS but many other  chronic and acute conditions are known to elevate hsTnI results.  Refer to the Links section for chest pain algorithms and additional  guidance. Performed at [facility], [address] [place], [place] [address]   Protime-INR (order if Patient is taking Coumadin / Warfarin)     Status: Abnormal   Collection Time: 10/15/20  3:10 PM  Result Value Ref Range   Prothrombin Time 19.1 (H) 11.4 - 15.2 seconds   INR 1.6 (H) 0.8 - 1.2    Comment: (NOTE) INR goal varies based on device and disease states. Performed at [facility], [address], [place], [place] [address]   Resp Panel by RT-PCR (Flu A&B, Covid) Nasopharyngeal Swab     Status: None   Collection Time: 10/15/20  4:22 PM   Specimen: Nasopharyngeal Swab; Nasopharyngeal(NP) swabs in vial transport medium  Result Value Ref Range   SARS Coronavirus 2 by RT PCR NEGATIVE NEGATIVE    Comment: (NOTE) SARS-CoV-2 target nucleic acids are NOT DETECTED.  The SARS-CoV-2 RNA is generally detectable in upper respiratory specimens during the acute phase of infection. The lowest concentration of SARS-CoV-2 viral copies this assay can detect is 138 copies/mL. A negative result does not preclude SARS-Cov-2 infection and should not be used as the sole basis for treatment or other patient management decisions. A negative result may occur with  improper specimen collection/handling, submission of specimen other than nasopharyngeal swab, presence of viral mutation(s) within the areas targeted by this assay, and inadequate number of viral copies(<138 copies/mL). A negative result must be combined with clinical observations,  patient history, and epidemiological information. The expected result is Negative.  Fact Sheet for Patients:  EntrepreneurPulse.com.au  Fact Sheet for Healthcare Providers:  IncredibleEmployment.be  This test is no t yet approved or cleared by the [place] FDA and  has been authorized for detection and/or diagnosis of SARS-CoV-2 by FDA under an Emergency Use Authorization (EUA). This EUA will remain  in effect (meaning this test can be used) for the duration of the COVID-19 declaration under Section 564(b)(1) of the Act, 21 U.S.C.section 360bbb-3(b)(1), unless the authorization is terminated  or revoked sooner.       Influenza A by PCR NEGATIVE NEGATIVE   Influenza B by PCR NEGATIVE NEGATIVE  Comment: (NOTE) The Xpert Xpress SARS-CoV-2/FLU/RSV plus assay is intended as an aid in the diagnosis of influenza from Nasopharyngeal swab specimens and should not be used as a sole basis for treatment. Nasal washings and aspirates are unacceptable for Xpert Xpress SARS-CoV-2/FLU/RSV testing.  Fact Sheet for Patients: EntrepreneurPulse.com.au  Fact Sheet for Healthcare Providers: IncredibleEmployment.be  This test is not yet approved or cleared by the [place] FDA and has been authorized for detection and/or diagnosis of SARS-CoV-2 by FDA under an Emergency Use Authorization (EUA). This EUA will remain in effect (meaning this test can be used) for the duration of the COVID-19 declaration under Section 564(b)(1) of the Act, 21 U.S.C. section 360bbb-3(b)(1), unless the authorization is terminated or revoked.  Performed at [facility], [address], [place], [place] [address]   Troponin I (High Sensitivity)     Status: Abnormal   Collection Time: 10/15/20  4:46 PM  Result Value Ref Range   Troponin I (High Sensitivity) 18 (H) <18 ng/L    Comment: (NOTE) Elevated high sensitivity troponin I (hsTnI)  values and significant  changes across serial measurements may suggest ACS but many other  chronic and acute conditions are known to elevate hsTnI results.  Refer to the "Links" section for chest pain algorithms and additional  guidance. Performed at [facility], [address] [place], [place] [address]   Glucose, capillary     Status: Abnormal   Collection Time: 10/15/20  9:10 PM  Result Value Ref Range   Glucose-Capillary 218 (H) 70 - 99 mg/dL    Comment: Glucose reference range applies only to samples taken after fasting for at least 8 hours.  Basic metabolic panel     Status: Abnormal   Collection Time: 10/16/20  6:49 AM  Result Value Ref Range   Sodium 136 135 - 145 mmol/L   Potassium 6.3 (HH) 3.5 - 5.1 mmol/L    Comment: CRITICAL RESULT CALLED TO, READ BACK BY AND VERIFIED WITH: LAUREN COLEMAN, RN'@0812'$  10/16/2020 KAY    Chloride 95 (L) 98 - 111 mmol/L   CO2 25 22 - 32 mmol/L   Glucose, Bld 205 (H) 70 - 99 mg/dL    Comment: Glucose reference range applies only to samples taken after fasting for at least 8 hours.   BUN 103 (H) 6 - 20 mg/dL   Creatinine, Ser 13.06 (H) 0.44 - 1.00 mg/dL   Calcium 9.0 8.9 - 10.3 mg/dL   GFR, Estimated 3 (L) >60 mL/min    Comment: (NOTE) Calculated using the CKD-EPI Creatinine Equation (2021)    Anion gap 16 (H) 5 - 15    Comment: Performed at [facility], [address] [place], [place] [address]  CBC     Status: Abnormal   Collection Time: 10/16/20  6:49 AM  Result Value Ref Range   WBC 9.3 4.0 - 10.5 K/uL   RBC 3.55 (L) 3.87 - 5.11 MIL/uL   Hemoglobin 10.5 (L) 12.0 - 15.0 g/dL   HCT 31.4 (L) 36.0 - 46.0 %   MCV 88.5 80.0 - 100.0 fL   MCH 29.6 26.0 - 34.0 pg   MCHC 33.4 30.0 - 36.0 g/dL   RDW 14.8 11.5 - 15.5 %   Platelets 266 150 - 400 K/uL   nRBC 0.0 0.0 - 0.2 %    Comment: Performed at [facility], [address] [place], [place] [address]  Protime-INR     Status: Abnormal   Collection Time: 10/16/20  6:49 AM  Result  Value  Ref Range   Prothrombin Time 21.2 (H) 11.4 - 15.2 seconds   INR 1.8 (H) 0.8 - 1.2    Comment: (NOTE) INR goal varies based on device and disease states. Performed at [facility], [address] [place], [place] [address]   Glucose, capillary     Status: Abnormal   Collection Time: 10/16/20  7:21 AM  Result Value Ref Range   Glucose-Capillary 184 (H) 70 - 99 mg/dL    Comment: Glucose reference range applies only to samples taken after fasting for at least 8 hours.    DG Chest 2 View  Result Date: 10/15/2020 CLINICAL DATA:  Chest pain and shortness of breath. EXAM: CHEST - 2 VIEW COMPARISON:  None. FINDINGS: The heart is enlarged. There is a vascular stent to the right of the mediastinum. Surgical clips adjacent to the right lung apex. Peribronchial thickening may be bronchitic or congestive minimal fluid in the fissures without significant sub pulmonic effusion. No confluent consolidation. No pneumothorax. No acute osseous abnormalities are seen. IMPRESSION: 1. Cardiomegaly. 2. Peribronchial thickening may be bronchitic or congestive. Fluid in the fissures. Findings suggest fluid overload. Electronically Signed   By: Keith Rake M.D.   On: 10/15/2020 15:37    ROS: As per H&P.  Other systems are reviewed and negative. Blood pressure 121/74, pulse 98, temperature 98.6 F (37 C), resp. rate 18, height '5\' 4"'$  (1.626 m), weight 118.1 kg, SpO2 99 %. Gen: Not in distress, comfortable Respiratory: Bibasal crackles, no wheezing or increased work of breathing Cardiovascular: Regular rate rhythm S1-S2 normal, no rubs GI: Abdomen soft, nontender, nondistended Extremities, left leg pitting edema noted.  Right BKA. Skin: No rash or ulcer Neurology: Sluggish and slow to answer.  Alert awake. Dialysis Access: Right upper extremity AV fistula has good thrill and bruit.  Assessment/Plan:  # ESRD: Was TTS schedule in [place] and now moved to [place]  without setting up outpatient dialysis.   Reportedly, the paperworks are underway in [place] but no chair time or schedule yet.  She now presents with uremia, hyperkalemia and fluid overload.  Plan for dialysis today using AV fistula.  She will need another dialysis tomorrow.  I will consult social work to follow and arrange outpatient dialysis.  I have discussed with the dialysis nurse and the primary team as well.  #Hyperkalemia: Received [place].  Plan for dialysis with low potassium bath today.  Monitor lab.  # Hypertension: Blood pressure acceptable.  Ultrafiltration with dialysis.  # Anemia of ESRD: Hemoglobin at goal. Check iron studies.  Continue monitor lab.  # Metabolic Bone Disease: Check phosphorus and PTH level.  We will try to get outpatient treatment record if possible.  #Acute metabolic encephalopathy/uremic: HD today and tomorrow.  Thank you for the consult.  We will follow with you.  Purva Vessell Tanna Furry 10/16/2020, 9:55 AM

## 2020-10-16 NOTE — TOC Initial Note (Signed)
Transition of Care [facility]) - Initial/Assessment Note    Patient Details  Name: Stacey Harrell MRN: [id number] Date of Birth: 06/28/1965  Transition [facility]) CM/SW Contact:    Boneta Lucks, RN Phone Number: 10/16/2020, 1:37 PM  Clinical Narrative:   TOC consulted to set up new Dialysis chair time at [facility] in [place], [place].  TOC contacted [facility].  They are requesting Hep B blood panel and TB or chest xray. Chest xray resulted. Patient is currenly in dialysis here at [place], [place] will draw labs.  TOC will fax result to Apolonio Schneiders at [facility] tomorrow fax [phone].    TOC to follow.          Expected Discharge Plan: Home/Self Care Barriers to Discharge: Continued Medical Work up   Patient Goals and CMS Choice Patient states their goals for this hospitalization and ongoing recovery are:: to return home. CMS Medicare.gov Compare Post Acute Care list provided to:: Patient Choice offered to / list presented to : Patient  Expected Discharge Plan and Services Expected Discharge Plan: Home/Self Care       Living arrangements for the past 2 months: Single Family Home                                      Prior Living Arrangements/Services Living arrangements for the past 2 months: Single Family Home Lives with:: Relatives Patient language and need for interpreter reviewed:: Yes        Need for Family Participation in Patient Care: Yes (Comment) Care giver support system in place?: Yes (comment)   Criminal Activity/Legal Involvement Pertinent to Current Situation/Hospitalization: No - Comment as needed  Activities of Daily Living Home Assistive Devices/Equipment: Wheelchair ADL Screening (condition at time of admission) Patient's cognitive ability adequate to safely complete daily activities?: Yes Is the patient deaf or have difficulty hearing?: No Does the patient have difficulty seeing, even when wearing glasses/contacts?: Yes Does the patient have  difficulty concentrating, remembering, or making decisions?: No Patient able to express need for assistance with ADLs?: Yes Does the patient have difficulty dressing or bathing?: Yes Independently performs ADLs?: No Toileting: Needs assistance Is this a change from baseline?: Pre-admission baseline Walks in Home: Needs assistance Is this a change from baseline?: Pre-admission baseline Does the patient have difficulty walking or climbing stairs?: Yes Weakness of Legs: Both Weakness of Arms/Hands: None  Permission Sought/Granted                  Emotional Assessment     Affect (typically observed): Accepting Orientation: : Oriented to Self, Oriented to Place, Oriented to  Time, Oriented to Situation Alcohol / Substance Use: Not Applicable Psych Involvement: No (comment)  Admission diagnosis:  Hyperkalemia [E87.5] Acute hyperkalemia [E87.5] Patient Active Problem List   Diagnosis Date Noted   Nonspecific chest pain    Acute CVA (cerebrovascular accident) ([place]) 10/15/2020   Acute hyperkalemia 10/15/2020   History of DVT (deep vein thrombosis) 10/28/2018   Hyperkalemia 04/29/2018   Morbid obesity with BMI of 40.0-44.9, adult ([place]) 02/10/2018   ESRD on hemodialysis ([place]) 12/02/2017   Coagulation defect ([place]) 01/13/2017   LBBB (left bundle branch block) 123456   Chronic systolic (congestive) heart failure ([place]) 01/08/2016   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

## 2020-10-16 NOTE — Progress Notes (Signed)
Date and time results received: 10/16/20 0813 (use smartphrase ".now" to insert current time)  Test:  Critical Value: Potassium 6.3  Name of Provider Notified: Dr.Tat  Orders Received? Or Actions Taken?: No new orders at this time

## 2020-10-16 NOTE — Progress Notes (Signed)
PROGRESS NOTE  Stacey Harrell [id number] DOB: December 10, 1965 DOA: 10/15/2020 PCP: Pcp, No  Brief History:  55 year old female with a history of ESRD (TTS), hypertension, systolic CHF, peripheral vascular disease, diabetes mellitus type 2, DVT of the lower extremity, hyperlipidemia, tobacco abuse in remission presenting with shortness of breath and chest discomfort.  The patient states that she has not been dialyzed since 10/10/2020.  She is relocating from [facility] to [place], but has not yet set up her outpatient dialysis center.  The patient endorses indiscretion with her diet and fluid intake.  She presented to the emergency department requesting dialysis.  She states that she has had constant shortness of breath and chest discomfort for the past 2 days.  She states that this usually occurs when she misses dialysis.  She endorses increased peripheral edema.  She denies any fevers, chills, nausea, vomiting, diarrhea, abd pain, hematochezia, melena.  In the emergency department, the patient was afebrile hemodynamically stable with oxygen saturation 99% on 2 L.  BMP showed sodium 133, potassium 6.0, serum creatinine 12.76.  WBC 9.7, rheumatoid 0, platelets 257,000 her chest x-ray showed interstitial thickening and fluid in the fissures.  Nephrology was consulted, and dialysis is being set up.  Assessment/Plan: Fluid overload -Secondary to missed dialysis -Nephrology consulted -Dialysis planned [id number]  Chronic systolic CHF -Recurrent fluid overload status secondary to missed dialysis rather than decompensated CHF -03/06/2018 echo EF 40-45%, mild MR, HK of the mid basal -Repeat echo  ESRD -Nephrology consulted -Continue Renvela -Planning dialysis on 10/16/2020 -Consulted social worker set up outpatient dialysis  Chest pain -Echocardiogram -Troponins not consistent with ACS  Hyperkalemia -Correction with dialysis  Diabetes mellitus type 2 -NovoLog  sliding scale  -Reduced dose Lantus -Check hemoglobin A1c  DVT of the lower extremity -Continue warfarin  Peripheral vascular disease -Status post right BKA  Hyperlipidemia -Continue statin      Status is: Inpatient  Remains inpatient appropriate because:Persistent severe electrolyte disturbances  Dispo: The patient is from: Home              Anticipated d/c is to: Home              Patient currently is not medically stable to d/c.   Difficult to place patient No        Family Communication:   significant other updated at bedside 7/4  Consultants:  renal  Code Status:  FULL  DVT Prophylaxis:  warfarin   Procedures: As Listed in Progress Note Above  Antibiotics: None      Subjective: Patient continues to have some chest discomfort.  She states her breathing is stabilized.  He denies any fever, chills, headache, nausea, vomiting, direct abdominal pain.  She wants to eat pepperoni pizza.  Objective: Vitals:   10/15/20 1850 10/15/20 2109 10/16/20 0142 10/16/20 0532  BP: 134/84 135/79 136/80 121/74  Pulse: 100 99 100 98  Resp: '18 18 18 18  '$ Temp: 98.1 F (36.7 C) 98 F (36.7 C) 98.2 F (36.8 C) 98.6 F (37 C)  TempSrc: Oral     SpO2: 93% 100% 100% 99%  Weight: 118.1 kg   118.1 kg  Height: '5\' 4"'$  (1.626 m)   '5\' 4"'$  (1.626 m)    Intake/Output Summary (Last 24 hours) at 10/16/2020 1038 Last data filed at 10/16/2020 0500 Gross per 24 hour  Intake 390 ml  Output 251 ml  Net 139 ml   Weight change:  Exam:  General:  Pt is alert, follows commands appropriately, not in acute distress HEENT: No icterus, No thrush, No neck mass, Foley/AT Cardiovascular: RRR, S1/S2, no rubs, no gallops Respiratory: Bilateral crackles but no wheezing.  Good air movement Abdomen: Soft/+BS, non tender, non distended, no guarding Extremities: 2+ LE edema, No lymphangitis, No petechiae, No rashes, no synovitis   Data Reviewed: I have personally reviewed following labs and  imaging studies Basic Metabolic Panel: Recent Labs  Lab 10/15/20 1510 10/16/20 0649  NA 133* 136  K 6.0* 6.3*  CL 94* 95*  CO2 24 25  GLUCOSE 287* 205*  BUN 103* 103*  CREATININE 12.76* 13.06*  CALCIUM 8.8* 9.0   Liver Function Tests: No results for input(s): AST, ALT, ALKPHOS, BILITOT, PROT, ALBUMIN in the last 168 hours. No results for input(s): LIPASE, AMYLASE in the last 168 hours. No results for input(s): AMMONIA in the last 168 hours. Coagulation Profile: Recent Labs  Lab 10/15/20 1510 10/16/20 0649  INR 1.6* 1.8*   CBC: Recent Labs  Lab 10/15/20 1510 10/16/20 0649  WBC 9.7 9.3  HGB 10.0* 10.5*  HCT 29.6* 31.4*  MCV 88.4 88.5  PLT 257 266   Cardiac Enzymes: No results for input(s): CKTOTAL, CKMB, CKMBINDEX, TROPONINI in the last 168 hours. BNP: Invalid input(s): POCBNP CBG: Recent Labs  Lab 10/15/20 2110 10/16/20 0721  GLUCAP 218* 184*   HbA1C: No results for input(s): HGBA1C in the last 72 hours. Urine analysis: No results found for: COLORURINE, APPEARANCEUR, LABSPEC, PHURINE, GLUCOSEU, HGBUR, BILIRUBINUR, KETONESUR, PROTEINUR, UROBILINOGEN, NITRITE, LEUKOCYTESUR Sepsis Labs: '@LABRCNTIP'$ (procalcitonin:4,lacticidven:4) ) Recent Results (from the past 240 hour(s))  Resp Panel by RT-PCR (Flu A&B, Covid) Nasopharyngeal Swab     Status: None   Collection Time: 10/15/20  4:22 PM   Specimen: Nasopharyngeal Swab; Nasopharyngeal(NP) swabs in vial transport medium  Result Value Ref Range Status   SARS Coronavirus 2 by RT PCR NEGATIVE NEGATIVE Final    Comment: (NOTE) SARS-CoV-2 target nucleic acids are NOT DETECTED.  The SARS-CoV-2 RNA is generally detectable in upper respiratory specimens during the acute phase of infection. The lowest concentration of SARS-CoV-2 viral copies this assay can detect is 138 copies/mL. A negative result does not preclude SARS-Cov-2 infection and should not be used as the sole basis for treatment or other patient  management decisions. A negative result may occur with  improper specimen collection/handling, submission of specimen other than nasopharyngeal swab, presence of viral mutation(s) within the areas targeted by this assay, and inadequate number of viral copies(<138 copies/mL). A negative result must be combined with clinical observations, patient history, and epidemiological information. The expected result is Negative.  Fact Sheet for Patients:  EntrepreneurPulse.com.au  Fact Sheet for Healthcare Providers:  IncredibleEmployment.be  This test is no t yet approved or cleared by the [place] FDA and  has been authorized for detection and/or diagnosis of SARS-CoV-2 by FDA under an Emergency Use Authorization (EUA). This EUA will remain  in effect (meaning this test can be used) for the duration of the COVID-19 declaration under Section 564(b)(1) of the Act, 21 U.S.C.section 360bbb-3(b)(1), unless the authorization is terminated  or revoked sooner.       Influenza A by PCR NEGATIVE NEGATIVE Final   Influenza B by PCR NEGATIVE NEGATIVE Final    Comment: (NOTE) The Xpert Xpress SARS-CoV-2/FLU/RSV plus assay is intended as an aid in the diagnosis of influenza from Nasopharyngeal swab specimens and should not be used as a sole basis for treatment. Nasal washings and aspirates  are unacceptable for Xpert Xpress SARS-CoV-2/FLU/RSV testing.  Fact Sheet for Patients: EntrepreneurPulse.com.au  Fact Sheet for Healthcare Providers: IncredibleEmployment.be  This test is not yet approved or cleared by the [place] FDA and has been authorized for detection and/or diagnosis of SARS-CoV-2 by FDA under an Emergency Use Authorization (EUA). This EUA will remain in effect (meaning this test can be used) for the duration of the COVID-19 declaration under Section 564(b)(1) of the Act, 21 U.S.C. section 360bbb-3(b)(1),  unless the authorization is terminated or revoked.  Performed at [facility], [address] [place], [place] [address]      Scheduled Meds:  Chlorhexidine Gluconate Cloth  6 each Topical Q0600   Chlorhexidine Gluconate Cloth  6 each Topical Q0600   insulin aspart  0-15 Units Subcutaneous TID WC   insulin aspart  0-5 Units Subcutaneous QHS   insulin detemir  14 Units Subcutaneous QHS   pantoprazole  40 mg Oral Daily   rosuvastatin  20 mg Oral Q7 days   sevelamer carbonate  3,200 mg Oral TID with meals   warfarin  7.5 mg Oral ONCE-1600   Warfarin - Pharmacist Dosing Inpatient   Does not apply q1600   Continuous Infusions:  Procedures/Studies: DG Chest 2 View  Result Date: 10/15/2020 CLINICAL DATA:  Chest pain and shortness of breath. EXAM: CHEST - 2 VIEW COMPARISON:  None. FINDINGS: The heart is enlarged. There is a vascular stent to the right of the mediastinum. Surgical clips adjacent to the right lung apex. Peribronchial thickening may be bronchitic or congestive minimal fluid in the fissures without significant sub pulmonic effusion. No confluent consolidation. No pneumothorax. No acute osseous abnormalities are seen. IMPRESSION: 1. Cardiomegaly. 2. Peribronchial thickening may be bronchitic or congestive. Fluid in the fissures. Findings suggest fluid overload. Electronically Signed   By: Keith Rake M.D.   On: 10/15/2020 15:37    Orson Eva, DO  [facility]  If 7PM-7AM, please contact night-coverage [email or web address] Password [email or web address] 10/16/2020, 10:38 AM   LOS: 1 day

## 2020-10-17 LAB — FERRITIN: Ferritin: 822 ng/mL — ABNORMAL HIGH (ref 11–307)

## 2020-10-17 LAB — RENAL FUNCTION PANEL
Albumin: 2.6 g/dL — ABNORMAL LOW (ref 3.5–5.0)
Anion gap: 12 (ref 5–15)
BUN: 46 mg/dL — ABNORMAL HIGH (ref 6–20)
CO2: 28 mmol/L (ref 22–32)
Calcium: 8.3 mg/dL — ABNORMAL LOW (ref 8.9–10.3)
Chloride: 92 mmol/L — ABNORMAL LOW (ref 98–111)
Creatinine, Ser: 7.09 mg/dL — ABNORMAL HIGH (ref 0.44–1.00)
GFR, Estimated: 6 mL/min — ABNORMAL LOW (ref >60)
Glucose, Bld: 159 mg/dL — ABNORMAL HIGH (ref 70–99)
Phosphorus: 5.3 mg/dL — ABNORMAL HIGH (ref 2.5–4.6)
Potassium: 4.5 mmol/L (ref 3.5–5.1)
Sodium: 132 mmol/L — ABNORMAL LOW (ref 135–145)

## 2020-10-17 LAB — HEMOGLOBIN A1C
Hgb A1c MFr Bld: 7.6 % — ABNORMAL HIGH (ref 4.8–5.6)
Mean Plasma Glucose: 171 mg/dL

## 2020-10-17 LAB — CBC
HCT: 27.3 % — ABNORMAL LOW (ref 36.0–46.0)
Hemoglobin: 9.4 g/dL — ABNORMAL LOW (ref 12.0–15.0)
MCH: 30.7 pg (ref 26.0–34.0)
MCHC: 34.4 g/dL (ref 30.0–36.0)
MCV: 89.2 fL (ref 80.0–100.0)
Platelets: 229 10*3/uL (ref 150–400)
RBC: 3.06 MIL/uL — ABNORMAL LOW (ref 3.87–5.11)
RDW: 14.9 % (ref 11.5–15.5)
WBC: 7.6 10*3/uL (ref 4.0–10.5)
nRBC: 0 % (ref 0.0–0.2)

## 2020-10-17 LAB — GLUCOSE, CAPILLARY
Glucose-Capillary: 138 mg/dL — ABNORMAL HIGH (ref 70–99)
Glucose-Capillary: 170 mg/dL — ABNORMAL HIGH (ref 70–99)
Glucose-Capillary: 140 mg/dL — ABNORMAL HIGH (ref 70–99)
Glucose-Capillary: 204 mg/dL — ABNORMAL HIGH (ref 70–99)

## 2020-10-17 LAB — IRON AND TIBC
Iron: 55 ug/dL (ref 28–170)
Saturation Ratios: 23 % (ref 10.4–31.8)
TIBC: 241 ug/dL — ABNORMAL LOW (ref 250–450)
UIBC: 186 ug/dL

## 2020-10-17 LAB — PROTIME-INR
INR: 2.1 — ABNORMAL HIGH (ref 0.8–1.2)
Prothrombin Time: 23.7 seconds — ABNORMAL HIGH (ref 11.4–15.2)

## 2020-10-17 LAB — TROPONIN I (HIGH SENSITIVITY)
Troponin I (High Sensitivity): 13 ng/L (ref <18)
Troponin I (High Sensitivity): 12 ng/L (ref <18)

## 2020-10-17 MED ORDER — DARBEPOETIN ALFA 60 MCG/0.3ML IJ SOSY
60.0000 ug | PREFILLED_SYRINGE | INTRAMUSCULAR | Status: DC
  Filled 2020-10-17: qty 0.3

## 2020-10-17 MED ORDER — LIDOCAINE HCL (PF) 1 % IJ SOLN: 5.0000 mL | INTRAMUSCULAR | Status: DC | PRN

## 2020-10-17 MED ORDER — LIDOCAINE-PRILOCAINE 2.5-2.5 % EX CREA: 1.0000 "application " | TOPICAL_CREAM | CUTANEOUS | Status: DC | PRN

## 2020-10-17 MED ORDER — SODIUM CHLORIDE 0.9 % IV SOLN
125.0000 mg | INTRAVENOUS | Status: DC
  Administered 2020-10-17 – 2020-10-19 (×2): 125 mg via INTRAVENOUS
  Filled 2020-10-17 (×2): qty 10

## 2020-10-17 MED ORDER — MIDODRINE HCL 5 MG PO TABS
10.0000 mg | ORAL_TABLET | ORAL | Status: DC
  Administered 2020-10-19: 10 mg via ORAL
  Filled 2020-10-17: qty 2

## 2020-10-17 MED ORDER — WARFARIN SODIUM 7.5 MG PO TABS
7.5000 mg | ORAL_TABLET | Freq: Once | ORAL | Status: AC
  Administered 2020-10-17: 7.5 mg via ORAL
  Filled 2020-10-17: qty 1

## 2020-10-17 MED ORDER — MIDODRINE HCL 10 MG PO TABS: 10.0000 mg | ORAL_TABLET | ORAL | 1 refills | Status: AC

## 2020-10-17 MED ORDER — HEPARIN SODIUM (PORCINE) 1000 UNIT/ML DIALYSIS: 1000.0000 [IU] | INTRAMUSCULAR | Status: DC | PRN

## 2020-10-17 MED ORDER — PENTAFLUOROPROP-TETRAFLUOROETH EX AERO: 1.0000 "application " | INHALATION_SPRAY | CUTANEOUS | Status: DC | PRN

## 2020-10-17 MED ORDER — SODIUM CHLORIDE 0.9 % IV SOLN: 100.0000 mL | INTRAVENOUS | Status: DC | PRN

## 2020-10-17 MED ORDER — ALTEPLASE 2 MG IJ SOLR: 2.0000 mg | Freq: Once | INTRAMUSCULAR | Status: DC | PRN

## 2020-10-17 MED ORDER — DARBEPOETIN ALFA 60 MCG/0.3ML IJ SOSY
PREFILLED_SYRINGE | INTRAMUSCULAR | Status: AC
  Administered 2020-10-17: 60 ug via INTRAVENOUS
  Filled 2020-10-17: qty 0.3

## 2020-10-17 MED ORDER — MIDODRINE HCL 5 MG PO TABS
ORAL_TABLET | ORAL | Status: AC
  Administered 2020-10-17: 10 mg via ORAL
  Filled 2020-10-17: qty 2

## 2020-10-17 NOTE — Progress Notes (Signed)
Responded to nursing call:  chest pressure   Subjective: Pt just finished HD.  1L removed.  Had chest discomfort/pressure Having some sob.  Denies f/c, n/v  HR 85--RR18--95% on RA ; 102/65  Vitals:   10/17/20 1600 10/17/20 1630 10/17/20 1700 10/17/20 1730  BP: 91/63 94/62 100/63 98/61  Pulse: 84 84 82 80  Resp:      Temp:      TempSrc:      SpO2:      Weight:      Height:       CV--RRR Lung--CTA Abd--soft+BS/NT   Assessment/Plan: Chest pressure -had it in ED -Cardiology consult in am -EKG -cycle troponins -Echo -cancel discharge     Orson Eva, DO [facility]

## 2020-10-17 NOTE — Progress Notes (Signed)
Personally reviewed EKG--sinus. Unchanged LBBB when compared to 7/3  DTat

## 2020-10-17 NOTE — Progress Notes (Signed)
[facility] NEPHROLOGY PROGRESS NOTE  Assessment/ Plan: Pt is a 55 y.o. yo female  with PMH insulin-dependent diabetes, hypertension, peripheral vascular disease a status post right BKA asthma, ESRD on HD TTS who presented with chest tightness and shortness of breath, seen as a consultation for the management of dialysis. Patient receives dialysis in [place] and now moved to [place] without establishing outpatient dialysis.  # ESRD: Was TTS schedule in [place] and now moved to [place] without setting up outpatient dialysis.  Reportedly, the paperworks are underway in [place] but no chair time or schedule yet.  She now presents with uremia, hyperkalemia and fluid overload.   Status post dialysis with around 1 L of ultrafiltration, the UF limited by hypotension.  We will plan for another HD today to resume TTS schedule.  Midodrine for intradialytic hypotension.  Her mental status and labs much better.  She will need another dialysis tomorrow.  I consulted social work to follow and arrange outpatient dialysis.  I have discussed with the dialysis nurse and the primary team as well.   #Hyperkalemia: Improved with dialysis.     # Hypertension: Blood pressure acceptable.  Ultrafiltration with dialysis.  Midodrine for intradialytic hypotension   # Anemia of ESRD: Hemoglobin below goal, iron saturation 23%.  I will order IV iron and ESA.   # Metabolic Bone Disease: Phosphorus level and calcium at goal.  Pending  PTH level.  We will try to get outpatient treatment record if possible.   #Acute metabolic encephalopathy/uremic: Mental status improving.  Subjective: Seen and examined at bedside.  She is more alert awake and responsive today.  Denies nausea, vomiting, chest pain, shortness of breath.  Her boyfriend at bedside.  She is asking when she can go home. Objective Vital signs in last 24 hours: Vitals:   10/16/20 1650 10/16/20 1700 10/16/20 2150 10/17/20 0506  BP:  (!) 94/53 110/60 (!) 103/59 92/71  Pulse:  80 85 88  Resp: '14 16 19 18  '$ Temp:  98 F (36.7 C) 98.1 F (36.7 C) 97.6 F (36.4 C)  TempSrc:  Oral  Oral  SpO2:   96% 95%  Weight:      Height:       Weight change: 2.886 kg  Intake/Output Summary (Last 24 hours) at 10/17/2020 0848 Last data filed at 10/17/2020 0825 Gross per 24 hour  Intake 840 ml  Output 1087 ml  Net -247 ml       Labs: Basic Metabolic Panel: Recent Labs  Lab 10/15/20 1510 10/16/20 0649 10/17/20 0509  NA 133* 136 132*  K 6.0* 6.3* 4.5  CL 94* 95* 92*  CO2 '24 25 28  '$ GLUCOSE 287* 205* 159*  BUN 103* 103* 46*  CREATININE 12.76* 13.06* 7.09*  CALCIUM 8.8* 9.0 8.3*  PHOS  --   --  5.3*   Liver Function Tests: Recent Labs  Lab 10/17/20 0509  ALBUMIN 2.6*   No results for input(s): LIPASE, AMYLASE in the last 168 hours. No results for input(s): AMMONIA in the last 168 hours. CBC: Recent Labs  Lab 10/15/20 1510 10/16/20 0649 10/17/20 0509  WBC 9.7 9.3 7.6  HGB 10.0* 10.5* 9.4*  HCT 29.6* 31.4* 27.3*  MCV 88.4 88.5 89.2  PLT 257 266 229   Cardiac Enzymes: No results for input(s): CKTOTAL, CKMB, CKMBINDEX, TROPONINI in the last 168 hours. CBG: Recent Labs  Lab 10/16/20 0721 10/16/20 1104 10/16/20 1736 10/16/20 2151 10/17/20 0720  GLUCAP 184* 269* 104* 186*  138*    Iron Studies:  Recent Labs    10/17/20 0509  IRON 55  TIBC 241*  FERRITIN 822*   Studies/Results: DG Chest 2 View  Result Date: 10/15/2020 CLINICAL DATA:  Chest pain and shortness of breath. EXAM: CHEST - 2 VIEW COMPARISON:  None. FINDINGS: The heart is enlarged. There is a vascular stent to the right of the mediastinum. Surgical clips adjacent to the right lung apex. Peribronchial thickening may be bronchitic or congestive minimal fluid in the fissures without significant sub pulmonic effusion. No confluent consolidation. No pneumothorax. No acute osseous abnormalities are seen. IMPRESSION: 1. Cardiomegaly. 2.  Peribronchial thickening may be bronchitic or congestive. Fluid in the fissures. Findings suggest fluid overload. Electronically Signed   By: Keith Rake M.D.   On: 10/15/2020 15:37    Medications: Infusions:   Scheduled Medications:  Chlorhexidine Gluconate Cloth  6 each Topical Q0600   Chlorhexidine Gluconate Cloth  6 each Topical Q0600   insulin aspart  0-15 Units Subcutaneous TID WC   insulin aspart  0-5 Units Subcutaneous QHS   insulin detemir  8 Units Subcutaneous QHS   pantoprazole  40 mg Oral Daily   rosuvastatin  20 mg Oral Q7 days   sevelamer carbonate  3,200 mg Oral TID with meals   Warfarin - Pharmacist Dosing Inpatient   Does not apply q1600    have reviewed scheduled and prn medications.  Physical Exam: General:NAD, comfortable Heart:RRR, s1s2 nl Lungs:clear b/l, no crackle Abdomen:soft, Non-tender, non-distended Extremities: Right BKA, left leg has trace pitting edema. Dialysis Access: Right upper extremity AV fistula has good thrill and bruit.  Stacey Harrell Stacey Harrell 10/17/2020,8:48 AM  LOS: 2 days

## 2020-10-17 NOTE — Discharge Summary (Addendum)
Physician Discharge Summary  Stacey Harrell [id number] DOB: 1965-07-31 DOA: 10/15/2020  Admit date: 10/15/2020 Discharge date: 10/19/2020  Admitted From: Home Disposition:  Home   Recommendations for Outpatient Follow-up:  Please go to [facility] center ON FRIDAY 7/8 at 2:15 pm as scheduled for dialysis Please establish care with PCP in 1-2 weeks Please obtain BMP/CBC in one week Please continue to monitor PT/INR   Discharge Condition: Stable CODE STATUS:FULL Diet recommendation: Heart Healthy / Carb Modified   Brief/Interim Summary: 55 year old female with a history of ESRD (TTS), hypertension, systolic CHF, peripheral vascular disease, diabetes mellitus type 2, DVT of the lower extremity, hyperlipidemia, tobacco abuse in remission presenting with shortness of breath and chest discomfort.  The patient states that she has not been dialyzed since 10/10/2020.  She is relocating from [facility] to [place], but has not yet set up her outpatient dialysis center.  The patient endorses indiscretion with her diet and fluid intake.  She presented to the emergency department requesting dialysis.  She states that she has had constant shortness of breath and chest discomfort for the past 2 days.  She states that this usually occurs when she misses dialysis.  She endorses increased peripheral edema.  She denies any fevers, chills, nausea, vomiting, diarrhea, abd pain, hematochezia, melena.  In the emergency department, the patient was afebrile hemodynamically stable with oxygen saturation 99% on 2 L.  BMP showed sodium 133, potassium 6.0, serum creatinine 12.76.  WBC 9.7,  platelets 257,000 her chest x-ray showed interstitial thickening and fluid in the fissures.  Nephrology was consulted, and dialysis is being set up.  Discharge Diagnoses:   Fluid overload -Secondary to missed dialysis -Nephrology consulted -Dialysis 10/16/2020 and 7/5, 7/7 -outpatient dialysis set up at  [facility] for MWF prior to discharge   Chronic systolic CHF -Recurrent fluid overload status secondary to missed dialysis rather than decompensated CHF -03/06/2018 echo EF 40-45%, mild MR, HK of the mid basal -outpatient cardiology follow up will be set up   ESRD -Nephrology consulted -Continue Renvela -Planning dialysis on 7/4/ 7/5, 7/7 -Consulted social worker set up outpatient dialysis PT HAS ARRANGEMENTS FOR OUTPATIENT DIALYSIS    Chest pain - RESOLVED -Troponins not consistent with ACS -resolved after HD -outpatient cardiology follow up - Seen by cardiology - follow up Echo, ok to DC if EF normal and no large pericardial effusion from uremia -Pt had stress test on 7/7.  Fixed defect found no recommendation for further testing, had cath in 2018 with no significant CAD per cardiology team.   Hyperkalemia -Corrected with dialysis   Diabetes mellitus type 2 -NovoLog sliding scale  -Reduced dose basal insulin during hospitalization -Check hemoglobin A1c--7.6   DVT of the lower extremity -Continue warfarin - INR is 2.0, therapeutic   Peripheral vascular disease -Status post right BKA   Hyperlipidemia -Continue statin  Morbid Obesity -BMI 45.22 -lifestyle modification  Discharge Instructions  Allergies as of 10/19/2020       Reactions   Iodinated Diagnostic Agents Hives, Shortness Of Breath, Nausea And Vomiting   IVP   Shellfish Allergy Hives, Nausea And Vomiting   "LOBSTER ONLY" Lobster   Dulaglutide Nausea And Vomiting   Gabapentin Other (See Comments)   Other reaction(s): Delirium   Lorazepam Other (See Comments)   Other reaction(s): Hallucination        Medication List     TAKE these medications    albuterol (5 MG/ML) 0.5% nebulizer solution Commonly known as: PROVENTIL Take 2.5 mg  by nebulization every 6 (six) hours as needed for wheezing or shortness of breath.   albuterol 108 (90 Base) MCG/ACT inhaler Commonly known as: VENTOLIN  HFA Inhale 2 puffs into the lungs every 4 (four) hours as needed for wheezing or shortness of breath.   Cholecalciferol 25 MCG (1000 UT) tablet Take 1,000 Units by mouth daily. Takes on Tues, Thurs,Sat   insulin aspart 100 UNIT/ML injection Commonly known as: novoLOG Inject 8 Units into the skin 3 (three) times daily before meals.   insulin detemir 100 UNIT/ML injection Commonly known as: LEVEMIR Inject 0.08 mLs (8 Units total) into the skin at bedtime. What changed: how much to take   lidocaine-prilocaine cream Commonly known as: EMLA Apply 1 application topically daily. Takes on Tues, Thurs, Sat   midodrine 10 MG tablet Commonly known as: PROAMATINE Take 1 tablet (10 mg total) by mouth every Monday, Wednesday, and Friday with hemodialysis.   pantoprazole 40 MG tablet Commonly known as: PROTONIX Take 40 mg by mouth daily.   rosuvastatin 20 MG tablet Commonly known as: CRESTOR Take 20 mg by mouth every 7 (seven) days.   sevelamer carbonate 800 MG tablet Commonly known as: RENVELA Take 4 tablets by mouth 3 (three) times daily.   warfarin 7.5 MG tablet Commonly known as: COUMADIN Take 3.5-7.5 mg by mouth daily. 3.5 mg daily T,R,S,S 7.5 mg daily M,W,F        Follow-up Information     [facility]. Go to.   Why: Mon, Wed, Frid - Chair time at 3:15 - please arrive at 2:15 on Friday of your first appointment to allow time for paperwork.               Allergies  Allergen Reactions   Iodinated Diagnostic Agents Hives, Shortness Of Breath and Nausea And Vomiting    IVP   Shellfish Allergy Hives and Nausea And Vomiting    "LOBSTER ONLY" Lobster    Dulaglutide Nausea And Vomiting   Gabapentin Other (See Comments)    Other reaction(s): Delirium    Lorazepam Other (See Comments)    Other reaction(s): Hallucination     Consultations: renal   Procedures/Studies: DG Chest 2 View  Result Date: 10/15/2020 CLINICAL DATA:  Chest pain and  shortness of breath. EXAM: CHEST - 2 VIEW COMPARISON:  None. FINDINGS: The heart is enlarged. There is a vascular stent to the right of the mediastinum. Surgical clips adjacent to the right lung apex. Peribronchial thickening may be bronchitic or congestive minimal fluid in the fissures without significant sub pulmonic effusion. No confluent consolidation. No pneumothorax. No acute osseous abnormalities are seen. IMPRESSION: 1. Cardiomegaly. 2. Peribronchial thickening may be bronchitic or congestive. Fluid in the fissures. Findings suggest fluid overload. Electronically Signed   By: Keith Rake M.D.   On: 10/15/2020 15:37   NM Myocar Multi W/Spect W/Wall Motion / EF  Result Date: 10/19/2020  Lexiscan stress is electrically nondiagnostic  Myoview scan shows mModerate decreased tracer activity involving anterior (mid,distal), anteroseptal (base, mid, distal), inferoseptal(distal), inferior (mid, distal) that improves only in the distal inferoseptal region. Does not appear to reflect significant ischemia. Overall reflects scar and probable soft tissue attenuation.  LVEF on gating calculated at 32% with hypokinesis worse in the anterior and septal walls.  High risk study based on degree of LV dysfunction    ECHOCARDIOGRAM COMPLETE  Result Date: 10/18/2020    ECHOCARDIOGRAM REPORT   Patient Name:   Stacey Harrell Date of Exam: 10/18/2020 Medical  Rec #:  [id number]      Height:       64.0 in Accession #:    [id number]     Weight:       263.4 lb Date of Birth:  [birth year]      BSA:          2.199 m Patient Age:    75 years       BP:           113/61 mmHg Patient Gender: F              HR:           93 bpm. Exam Location:  [facility] Procedure: 2D Echo, Cardiac Doppler and Color Doppler Indications:    Chest Pain  History:        Patient has no prior history of Echocardiogram examinations.                 CHF, Stroke, Arrythmias:LBBB; Risk Factors:Diabetes.  Sonographer:    Wenda Low Referring Phys: [phone]  DAVID TAT IMPRESSIONS  1. Left ventricular ejection fraction, by estimation, is 30 to 35%. The left ventricle has moderately decreased function. The left ventricle demonstrates global hypokinesis. The left ventricular internal cavity size was moderately dilated. Left ventricular diastolic parameters were normal.  2. Right ventricular systolic function is normal. The right ventricular size is normal. There is moderately elevated pulmonary artery systolic pressure.  3. The mitral valve is abnormal. Mild mitral valve regurgitation. No evidence of mitral stenosis. Moderate mitral annular calcification.  4. Calcified non coronary cusp. The aortic valve is tricuspid. There is moderate calcification of the aortic valve. Aortic valve regurgitation is not visualized. Mild to moderate aortic valve sclerosis/calcification is present, without any evidence of aortic stenosis.  5. The inferior vena cava is dilated in size with >50% respiratory variability, suggesting right atrial pressure of 8 mmHg. FINDINGS  Left Ventricle: Left ventricular ejection fraction, by estimation, is 30 to 35%. The left ventricle has moderately decreased function. The left ventricle demonstrates global hypokinesis. The left ventricular internal cavity size was moderately dilated. There is no left ventricular hypertrophy. Left ventricular diastolic parameters were normal. Right Ventricle: The right ventricular size is normal. No increase in right ventricular wall thickness. Right ventricular systolic function is normal. There is moderately elevated pulmonary artery systolic pressure. The tricuspid regurgitant velocity is 3.17 m/s, and with an assumed right atrial pressure of 8 mmHg, the estimated right ventricular systolic pressure is 99991111 mmHg. Left Atrium: Left atrial size was normal in size. Right Atrium: Right atrial size was normal in size. Pericardium: There is no evidence of pericardial effusion. Mitral Valve: The mitral valve is abnormal. There  is mild thickening of the mitral valve leaflet(s). There is mild calcification of the mitral valve leaflet(s). Moderate mitral annular calcification. Mild mitral valve regurgitation. No evidence of mitral  valve stenosis. MV peak gradient, 8.1 mmHg. The mean mitral valve gradient is 2.0 mmHg. Tricuspid Valve: The tricuspid valve is normal in structure. Tricuspid valve regurgitation is not demonstrated. No evidence of tricuspid stenosis. Aortic Valve: Calcified non coronary cusp. The aortic valve is tricuspid. There is moderate calcification of the aortic valve. Aortic valve regurgitation is not visualized. Mild to moderate aortic valve sclerosis/calcification is present, without any evidence of aortic stenosis. Aortic valve mean gradient measures 4.0 mmHg. Aortic valve peak gradient measures 7.7 mmHg. Aortic valve area, by VTI measures 1.94 cm. Pulmonic Valve: The pulmonic valve was normal in structure.  Pulmonic valve regurgitation is not visualized. No evidence of pulmonic stenosis. Aorta: The aortic root is normal in size and structure. Venous: The inferior vena cava is dilated in size with greater than 50% respiratory variability, suggesting right atrial pressure of 8 mmHg. IAS/Shunts: No atrial level shunt detected by color flow Doppler.  LEFT VENTRICLE PLAX 2D LVIDd:         5.46 cm     Diastology LVIDs:         4.12 cm     LV e' medial:    7.13 cm/s LV PW:         0.91 cm     LV E/e' medial:  17.4 LV IVS:        0.87 cm     LV e' lateral:   10.40 cm/s LVOT diam:     1.90 cm     LV E/e' lateral: 11.9 LV SV:         51 LV SV Index:   23 LVOT Area:     2.84 cm  LV Volumes (MOD) LV vol d, MOD A2C: 98.3 ml LV vol d, MOD A4C: 85.8 ml LV vol s, MOD A2C: 72.7 ml LV vol s, MOD A4C: 61.9 ml LV SV MOD A2C:     25.6 ml LV SV MOD A4C:     85.8 ml LV SV MOD BP:      22.9 ml RIGHT VENTRICLE RV Basal diam:  3.30 cm RV S prime:     9.45 cm/s TAPSE (M-mode): 1.9 cm LEFT ATRIUM             Index       RIGHT ATRIUM            Index LA diam:        3.90 cm 1.77 cm/m  RA Area:     14.50 cm LA Vol (A2C):   51.6 ml 23.46 ml/m RA Volume:   35.20 ml  16.00 ml/m LA Vol (A4C):   76.3 ml 34.69 ml/m LA Biplane Vol: 66.0 ml 30.01 ml/m  AORTIC VALVE AV Area (Vmax):    1.93 cm AV Area (Vmean):   1.69 cm AV Area (VTI):     1.94 cm AV Vmax:           139.00 cm/s AV Vmean:          92.700 cm/s AV VTI:            0.264 m AV Peak Grad:      7.7 mmHg AV Mean Grad:      4.0 mmHg LVOT Vmax:         94.60 cm/s LVOT Vmean:        55.400 cm/s LVOT VTI:          0.181 m LVOT/AV VTI ratio: 0.69 MITRAL VALVE                TRICUSPID VALVE MV Area (PHT): 7.44 cm     TR Peak grad:   40.2 mmHg MV Area VTI:   2.48 cm     TR Vmax:        317.00 cm/s MV Peak grad:  8.1 mmHg MV Mean grad:  2.0 mmHg     SHUNTS MV Vmax:       1.42 m/s     Systemic VTI:  0.18 m MV Vmean:      60.8 cm/s    Systemic Diam: 1.90 cm MV Decel Time: 102 msec MV  E velocity: 124.00 cm/s MV A velocity: 73.80 cm/s MV E/A ratio:  1.68 Jenkins Rouge MD Electronically signed by Jenkins Rouge MD Signature Date/Time: 10/18/2020/11:53:15 AM    Final     Discharge Exam: Vitals:   10/19/20 0452 10/19/20 1302  BP: 115/66   Pulse: 87 90  Resp: 20 18  Temp: 98.3 F (36.8 C) 97.6 F (36.4 C)  SpO2: 94% 92%   Vitals:   10/18/20 1205 10/18/20 2202 10/19/20 0452 10/19/20 1302  BP: 107/69 95/62 115/66   Pulse: 93 85 87 90  Resp: '19 18 20 18  '$ Temp: 97.8 F (36.6 C) 98.2 F (36.8 C) 98.3 F (36.8 C) 97.6 F (36.4 C)  TempSrc: Oral   Oral  SpO2: 98% 95% 94% 92%  Weight:      Height:       General: Pt is alert, awake, not in acute distress Cardiovascular: normal S1/S2 +, no rubs, no gallops Respiratory: CTA bilaterally, no wheezing, no rhonchi Abdominal: Soft, NT, ND, bowel sounds + Extremities: trace LE edema, no cyanosis  The results of significant diagnostics from this hospitalization (including imaging, microbiology, ancillary and laboratory) are listed below for reference.     Significant Diagnostic Studies: DG Chest 2 View  Result Date: 10/15/2020 CLINICAL DATA:  Chest pain and shortness of breath. EXAM: CHEST - 2 VIEW COMPARISON:  None. FINDINGS: The heart is enlarged. There is a vascular stent to the right of the mediastinum. Surgical clips adjacent to the right lung apex. Peribronchial thickening may be bronchitic or congestive minimal fluid in the fissures without significant sub pulmonic effusion. No confluent consolidation. No pneumothorax. No acute osseous abnormalities are seen. IMPRESSION: 1. Cardiomegaly. 2. Peribronchial thickening may be bronchitic or congestive. Fluid in the fissures. Findings suggest fluid overload. Electronically Signed   By: Keith Rake M.D.   On: 10/15/2020 15:37   NM Myocar Multi W/Spect W/Wall Motion / EF  Result Date: 10/19/2020  Lexiscan stress is electrically nondiagnostic  Myoview scan shows mModerate decreased tracer activity involving anterior (mid,distal), anteroseptal (base, mid, distal), inferoseptal(distal), inferior (mid, distal) that improves only in the distal inferoseptal region. Does not appear to reflect significant ischemia. Overall reflects scar and probable soft tissue attenuation.  LVEF on gating calculated at 32% with hypokinesis worse in the anterior and septal walls.  High risk study based on degree of LV dysfunction    ECHOCARDIOGRAM COMPLETE  Result Date: 10/18/2020    ECHOCARDIOGRAM REPORT   Patient Name:   Stacey Harrell Date of Exam: 10/18/2020 Medical Rec #:  [id number]      Height:       64.0 in Accession #:    [id number]     Weight:       263.4 lb Date of Birth:  [birth year]      BSA:          2.199 m Patient Age:    40 years       BP:           113/61 mmHg Patient Gender: F              HR:           93 bpm. Exam Location:  [facility] Procedure: 2D Echo, Cardiac Doppler and Color Doppler Indications:    Chest Pain  History:        Patient has no prior history of Echocardiogram examinations.  CHF, Stroke, Arrythmias:LBBB; Risk Factors:Diabetes.  Sonographer:    Wenda Low Referring Phys: [phone] DAVID TAT IMPRESSIONS  1. Left ventricular ejection fraction, by estimation, is 30 to 35%. The left ventricle has moderately decreased function. The left ventricle demonstrates global hypokinesis. The left ventricular internal cavity size was moderately dilated. Left ventricular diastolic parameters were normal.  2. Right ventricular systolic function is normal. The right ventricular size is normal. There is moderately elevated pulmonary artery systolic pressure.  3. The mitral valve is abnormal. Mild mitral valve regurgitation. No evidence of mitral stenosis. Moderate mitral annular calcification.  4. Calcified non coronary cusp. The aortic valve is tricuspid. There is moderate calcification of the aortic valve. Aortic valve regurgitation is not visualized. Mild to moderate aortic valve sclerosis/calcification is present, without any evidence of aortic stenosis.  5. The inferior vena cava is dilated in size with >50% respiratory variability, suggesting right atrial pressure of 8 mmHg. FINDINGS  Left Ventricle: Left ventricular ejection fraction, by estimation, is 30 to 35%. The left ventricle has moderately decreased function. The left ventricle demonstrates global hypokinesis. The left ventricular internal cavity size was moderately dilated. There is no left ventricular hypertrophy. Left ventricular diastolic parameters were normal. Right Ventricle: The right ventricular size is normal. No increase in right ventricular wall thickness. Right ventricular systolic function is normal. There is moderately elevated pulmonary artery systolic pressure. The tricuspid regurgitant velocity is 3.17 m/s, and with an assumed right atrial pressure of 8 mmHg, the estimated right ventricular systolic pressure is 99991111 mmHg. Left Atrium: Left atrial size was normal in size. Right Atrium: Right atrial size was normal in size.  Pericardium: There is no evidence of pericardial effusion. Mitral Valve: The mitral valve is abnormal. There is mild thickening of the mitral valve leaflet(s). There is mild calcification of the mitral valve leaflet(s). Moderate mitral annular calcification. Mild mitral valve regurgitation. No evidence of mitral  valve stenosis. MV peak gradient, 8.1 mmHg. The mean mitral valve gradient is 2.0 mmHg. Tricuspid Valve: The tricuspid valve is normal in structure. Tricuspid valve regurgitation is not demonstrated. No evidence of tricuspid stenosis. Aortic Valve: Calcified non coronary cusp. The aortic valve is tricuspid. There is moderate calcification of the aortic valve. Aortic valve regurgitation is not visualized. Mild to moderate aortic valve sclerosis/calcification is present, without any evidence of aortic stenosis. Aortic valve mean gradient measures 4.0 mmHg. Aortic valve peak gradient measures 7.7 mmHg. Aortic valve area, by VTI measures 1.94 cm. Pulmonic Valve: The pulmonic valve was normal in structure. Pulmonic valve regurgitation is not visualized. No evidence of pulmonic stenosis. Aorta: The aortic root is normal in size and structure. Venous: The inferior vena cava is dilated in size with greater than 50% respiratory variability, suggesting right atrial pressure of 8 mmHg. IAS/Shunts: No atrial level shunt detected by color flow Doppler.  LEFT VENTRICLE PLAX 2D LVIDd:         5.46 cm     Diastology LVIDs:         4.12 cm     LV e' medial:    7.13 cm/s LV PW:         0.91 cm     LV E/e' medial:  17.4 LV IVS:        0.87 cm     LV e' lateral:   10.40 cm/s LVOT diam:     1.90 cm     LV E/e' lateral: 11.9 LV SV:         51  LV SV Index:   23 LVOT Area:     2.84 cm  LV Volumes (MOD) LV vol d, MOD A2C: 98.3 ml LV vol d, MOD A4C: 85.8 ml LV vol s, MOD A2C: 72.7 ml LV vol s, MOD A4C: 61.9 ml LV SV MOD A2C:     25.6 ml LV SV MOD A4C:     85.8 ml LV SV MOD BP:      22.9 ml RIGHT VENTRICLE RV Basal diam:  3.30 cm  RV S prime:     9.45 cm/s TAPSE (M-mode): 1.9 cm LEFT ATRIUM             Index       RIGHT ATRIUM           Index LA diam:        3.90 cm 1.77 cm/m  RA Area:     14.50 cm LA Vol (A2C):   51.6 ml 23.46 ml/m RA Volume:   35.20 ml  16.00 ml/m LA Vol (A4C):   76.3 ml 34.69 ml/m LA Biplane Vol: 66.0 ml 30.01 ml/m  AORTIC VALVE AV Area (Vmax):    1.93 cm AV Area (Vmean):   1.69 cm AV Area (VTI):     1.94 cm AV Vmax:           139.00 cm/s AV Vmean:          92.700 cm/s AV VTI:            0.264 m AV Peak Grad:      7.7 mmHg AV Mean Grad:      4.0 mmHg LVOT Vmax:         94.60 cm/s LVOT Vmean:        55.400 cm/s LVOT VTI:          0.181 m LVOT/AV VTI ratio: 0.69 MITRAL VALVE                TRICUSPID VALVE MV Area (PHT): 7.44 cm     TR Peak grad:   40.2 mmHg MV Area VTI:   2.48 cm     TR Vmax:        317.00 cm/s MV Peak grad:  8.1 mmHg MV Mean grad:  2.0 mmHg     SHUNTS MV Vmax:       1.42 m/s     Systemic VTI:  0.18 m MV Vmean:      60.8 cm/s    Systemic Diam: 1.90 cm MV Decel Time: 102 msec MV E velocity: 124.00 cm/s MV A velocity: 73.80 cm/s MV E/A ratio:  1.68 Jenkins Rouge MD Electronically signed by Jenkins Rouge MD Signature Date/Time: 10/18/2020/11:53:15 AM    Final     Microbiology: Recent Results (from the past 240 hour(s))  Resp Panel by RT-PCR (Flu A&B, Covid) Nasopharyngeal Swab     Status: None   Collection Time: 10/15/20  4:22 PM   Specimen: Nasopharyngeal Swab; Nasopharyngeal(NP) swabs in vial transport medium  Result Value Ref Range Status   SARS Coronavirus 2 by RT PCR NEGATIVE NEGATIVE Final    Comment: (NOTE) SARS-CoV-2 target nucleic acids are NOT DETECTED.  The SARS-CoV-2 RNA is generally detectable in upper respiratory specimens during the acute phase of infection. The lowest concentration of SARS-CoV-2 viral copies this assay can detect is 138 copies/mL. A negative result does not preclude SARS-Cov-2 infection and should not be used as the sole basis for treatment or other  patient management decisions. A negative result may  occur with  improper specimen collection/handling, submission of specimen other than nasopharyngeal swab, presence of viral mutation(s) within the areas targeted by this assay, and inadequate number of viral copies(<138 copies/mL). A negative result must be combined with clinical observations, patient history, and epidemiological information. The expected result is Negative.  Fact Sheet for Patients:  EntrepreneurPulse.com.au  Fact Sheet for Healthcare Providers:  IncredibleEmployment.be  This test is no t yet approved or cleared by the [place] FDA and  has been authorized for detection and/or diagnosis of SARS-CoV-2 by FDA under an Emergency Use Authorization (EUA). This EUA will remain  in effect (meaning this test can be used) for the duration of the COVID-19 declaration under Section 564(b)(1) of the Act, 21 U.S.C.section 360bbb-3(b)(1), unless the authorization is terminated  or revoked sooner.       Influenza A by PCR NEGATIVE NEGATIVE Final   Influenza B by PCR NEGATIVE NEGATIVE Final    Comment: (NOTE) The Xpert Xpress SARS-CoV-2/FLU/RSV plus assay is intended as an aid in the diagnosis of influenza from Nasopharyngeal swab specimens and should not be used as a sole basis for treatment. Nasal washings and aspirates are unacceptable for Xpert Xpress SARS-CoV-2/FLU/RSV testing.  Fact Sheet for Patients: EntrepreneurPulse.com.au  Fact Sheet for Healthcare Providers: IncredibleEmployment.be  This test is not yet approved or cleared by the [place] FDA and has been authorized for detection and/or diagnosis of SARS-CoV-2 by FDA under an Emergency Use Authorization (EUA). This EUA will remain in effect (meaning this test can be used) for the duration of the COVID-19 declaration under Section 564(b)(1) of the Act, 21 U.S.C. section  360bbb-3(b)(1), unless the authorization is terminated or revoked.  Performed at [facility], [address] [place], [place] [address]     Labs: Basic Metabolic Panel: Recent Labs  Lab 10/15/20 1510 10/16/20 0649 10/17/20 0509  NA 133* 136 132*  K 6.0* 6.3* 4.5  CL 94* 95* 92*  CO2 '24 25 28  '$ GLUCOSE 287* 205* 159*  BUN 103* 103* 46*  CREATININE 12.76* 13.06* 7.09*  CALCIUM 8.8* 9.0 8.3*  PHOS  --   --  5.3*   Liver Function Tests: Recent Labs  Lab 10/17/20 0509  ALBUMIN 2.6*   No results for input(s): LIPASE, AMYLASE in the last 168 hours. No results for input(s): AMMONIA in the last 168 hours. CBC: Recent Labs  Lab 10/15/20 1510 10/16/20 0649 10/17/20 0509 10/18/20 0538 10/19/20 0554  WBC 9.7 9.3 7.6 9.3 8.4  HGB 10.0* 10.5* 9.4* 9.8* 9.4*  HCT 29.6* 31.4* 27.3* 29.4* 27.7*  MCV 88.4 88.5 89.2 90.2 88.5  PLT 257 266 229 232 238   Cardiac Enzymes: No results for input(s): CKTOTAL, CKMB, CKMBINDEX, TROPONINI in the last 168 hours. BNP: Invalid input(s): POCBNP CBG: Recent Labs  Lab 10/18/20 0740 10/18/20 1113 10/18/20 1641 10/18/20 2202 10/19/20 0735  GLUCAP 172* 174* 141* 150* 141*   Time coordinating discharge:  36 minutes  Signed:  Irwin Brakeman, MD [facility] How to contact the [facility] Attending or Consulting provider [place] or covering provider during after hours [place], for this patient?  Check the care team in [facility] and look for a) attending/consulting [facility] provider listed and b) the [facility] team listed Log into [email or web address] and use [place]'s universal password to access. If you do not have the password, please contact the hospital operator. Locate the [facility] provider you are looking for under [facility] and page to a number that you can be  directly reached. If you still have difficulty reaching the provider, please page the [facility] (Director on Call) for the Hospitalists listed on amion for assistance.  10/19/2020, 3:35 PM

## 2020-10-17 NOTE — Procedures (Signed)
HEMODIALYSIS TREATMENT NOTE:  3 hour heparin-free HD completed via right upper arm AVF (15g/antegrade).  Pt requested to end HD session after 3 hours as she is scheduled to dialyze again tomorrow at her new outpatient facility.    Goal NOT met due to soft blood pressures despite Midodrine.  Net UF 1L.  She c/o "tightness in my chest and labored breathing"  at end of HD session.  102/65, p84, r18, spO2 95% on RA.  Seen by Dr. Tat who ordered troponin, EKG, and overnight observation.  All blood was returned and hemostasis was achieved in 6 minutes.  No other changes from pre-HD assessment.  Angela Poteat, RN 

## 2020-10-17 NOTE — Progress Notes (Signed)
ANTICOAGULATION CONSULT NOTE - Initial Up Consult   Pharmacy Consult for warfarin dosing  Indication: VTE prophylaxis    Allergies  Allergen Reactions   Iodinated Diagnostic Agents Hives, Shortness Of Breath and Nausea And Vomiting    IVP   Shellfish Allergy Hives and Nausea And Vomiting    "LOBSTER ONLY" Lobster    Dulaglutide Nausea And Vomiting   Gabapentin Other (See Comments)    Other reaction(s): Delirium    Lorazepam Other (See Comments)    Other reaction(s): Hallucination       Patient Measurements: Last Weight  Most recent update: 10/16/2020  1:48 PM    Weight  118.1 kg (260 lb 5.8 oz)            Body mass index is 44.69 kg/m. Stacey Harrell               Temp: 97.6 F (36.4 C) (07/05 0506) Temp Source: Oral (07/05 0506) BP: 92/71 (07/05 0506) Pulse Rate: 88 (07/05 0506)  Labs: Recent Labs    10/15/20 1510 10/16/20 0649 10/17/20 0509  HGB 10.0* 10.5* 9.4*  HCT 29.6* 31.4* 27.3*  PLT 257 266 229  LABPROT 19.1* 21.2* 23.7*  INR 1.6* 1.8* 2.1*  CREATININE 12.76* 13.06* 7.09*     Estimated Creatinine Clearance: 11.2 mL/min (A) (by C-G formula based on SCr of 7.09 mg/dL (H)).     Medications:  Medications Prior to Admission  Medication Sig Dispense Refill Last Dose   albuterol (PROVENTIL) (5 MG/ML) 0.5% nebulizer solution Take 2.5 mg by nebulization every 6 (six) hours as needed for wheezing or shortness of breath.   PRN   albuterol (VENTOLIN HFA) 108 (90 Base) MCG/ACT inhaler Inhale 2 puffs into the lungs every 4 (four) hours as needed for wheezing or shortness of breath.   10/15/2020   Cholecalciferol 25 MCG (1000 UT) tablet Take 1,000 Units by mouth daily. Takes on Tues, Thurs,Sat   10/10/2020   insulin aspart (NOVOLOG) 100 UNIT/ML injection Inject 8 Units into the skin 3 (three) times daily before meals.   10/14/2020   insulin detemir (LEVEMIR) 100 UNIT/ML injection Inject 14 Units into the skin at bedtime.   10/14/2020   lidocaine-prilocaine (EMLA)  cream Apply 1 application topically daily. Takes on Tues, Thurs, Sat   10/10/2020   pantoprazole (PROTONIX) 40 MG tablet Take 40 mg by mouth daily.   10/14/2020   rosuvastatin (CRESTOR) 20 MG tablet Take 20 mg by mouth every 7 (seven) days.   Past Week   sevelamer carbonate (RENVELA) 800 MG tablet Take 4 tablets by mouth 3 (three) times daily.   10/14/2020   warfarin (COUMADIN) 7.5 MG tablet Take 3.5-7.5 mg by mouth daily. 3.5 mg daily T,R,S,S 7.5 mg daily M,W,F   10/14/2020 at 1000   Scheduled:   Chlorhexidine Gluconate Cloth  6 each Topical Q0600   Chlorhexidine Gluconate Cloth  6 each Topical Q0600   darbepoetin (ARANESP) injection - DIALYSIS  60 mcg Intravenous Q Tue-HD   insulin aspart  0-15 Units Subcutaneous TID WC   insulin aspart  0-5 Units Subcutaneous QHS   insulin detemir  8 Units Subcutaneous QHS   midodrine  10 mg Oral Q T,Th,Sa-HD   pantoprazole  40 mg Oral Daily   rosuvastatin  20 mg Oral Q7 days   sevelamer carbonate  3,200 mg Oral TID with meals   warfarin  7.5 mg Oral ONCE-1600   Warfarin - Pharmacist Dosing Inpatient   Does not apply q1600  Infusions:   ferric gluconate (FERRLECIT) IVPB      PRN: albuterol, ondansetron **OR** ondansetron (ZOFRAN) IV Anti-infectives (From admission, onward)    None       Goal of Therapy:  INR 2-3 Monitor platelets by anticoagulation protocol: Yes    Prior to Admission Warfarin Dosing:  Stacey Harrell takes 7.'5mg'$  of warfarin MWF and 3.'5mg'$  qTuThSaSun     Admit INR was 1.6 Lab Results  Component Value Date   INR 2.1 (H) 10/17/2020   INR 1.8 (H) 10/16/2020   INR 1.6 (H) 10/15/2020    Assessment: Stacey Harrell a 55 y.o. female requires anticoagulation with warfarin for the indication of  VTE prophylaxis. Warfarin will be initiated inpatient following pharmacy protocol per pharmacy consult. Patient most recent blood work is as follows:  7/4 INR 1.8, subtherapeutic  7/5 INR 2.1, therapeutic   CBC Latest Ref Rng & Units  10/17/2020 10/16/2020 10/15/2020  WBC 4.0 - 10.5 K/uL 7.6 9.3 9.7  Hemoglobin 12.0 - 15.0 g/dL 9.4(L) 10.5(L) 10.0(L)  Hematocrit 36.0 - 46.0 % 27.3(L) 31.4(L) 29.6(L)  Platelets 150 - 400 K/uL 229 266 257     Plan: Warfarin 7.'5mg'$  po x 1  Monitor CBC daily with am labs   Monitor INR daily Monitor for signs and symptoms of bleeding   Donna Christen Lynx Goodrich, PharmD, MBA, BCGP Clinical Pharmacist

## 2020-10-17 NOTE — TOC Transition Note (Signed)
Transition of Care [facility]) - CM/SW Discharge Note   Patient Details  Name: Jannell Triola MRN: [id number] Date of Birth: 1965/12/29  Transition of Care [facility]) CM/SW Contact:  Boneta Lucks, RN Phone Number: 10/17/2020, 12:50 PM   Clinical Narrative:   Faxed lab results and COVID results to [facility]. Patient was approved.  Ref # [id number].  Chair time is MWF at 3:15. First treatment is schedule for tomorrow, patient needs to arrive at 2:15 for paperwork. TOC explained to patient and added to AVS.    Final next level of care: Home/Self Care Barriers to Discharge: Barriers Resolved   Patient Goals and CMS Choice Patient states their goals for this hospitalization and ongoing recovery are:: to return home. CMS Medicare.gov Compare Post Acute Care list provided to:: Patient Choice offered to / list presented to : Patient  Discharge Placement                Patient to be transferred to facility by: Boy Friend   Patient and family notified of of transfer: 10/17/20  Discharge Plan and Services                                     Social Determinants of Health (SDOH) Interventions     Readmission Risk Interventions No flowsheet data found.

## 2020-10-18 ENCOUNTER — Inpatient Hospital Stay (HOSPITAL_COMMUNITY): Payer: Medicare HMO

## 2020-10-18 DIAGNOSIS — R079 Chest pain, unspecified: Secondary | ICD-10-CM

## 2020-10-18 LAB — ECHOCARDIOGRAM COMPLETE
AR max vel: 1.93 cm2
AV Area VTI: 1.94 cm2
AV Area mean vel: 1.69 cm2
AV Mean grad: 4 mmHg
AV Peak grad: 7.7 mmHg
Ao pk vel: 1.39 m/s
Area-P 1/2: 7.44 cm2
Calc EF: 26.4 %
Height: 64 in
MV VTI: 2.48 cm2
S' Lateral: 4.12 cm
Single Plane A2C EF: 26 %
Single Plane A4C EF: 27.9 %
Weight: 4215.2 oz

## 2020-10-18 LAB — PROTIME-INR
INR: 2.4 — ABNORMAL HIGH (ref 0.8–1.2)
Prothrombin Time: 26 seconds — ABNORMAL HIGH (ref 11.4–15.2)

## 2020-10-18 LAB — CBC
HCT: 29.4 % — ABNORMAL LOW (ref 36.0–46.0)
Hemoglobin: 9.8 g/dL — ABNORMAL LOW (ref 12.0–15.0)
MCH: 30.1 pg (ref 26.0–34.0)
MCHC: 33.3 g/dL (ref 30.0–36.0)
MCV: 90.2 fL (ref 80.0–100.0)
Platelets: 232 10*3/uL (ref 150–400)
RBC: 3.26 MIL/uL — ABNORMAL LOW (ref 3.87–5.11)
RDW: 14.9 % (ref 11.5–15.5)
WBC: 9.3 10*3/uL (ref 4.0–10.5)
nRBC: 0 % (ref 0.0–0.2)

## 2020-10-18 LAB — HEPATITIS B SURFACE ANTIBODY, QUANTITATIVE: Hep B S AB Quant (Post): >1000 m[IU]/mL (ref >9.9)

## 2020-10-18 LAB — GLUCOSE, CAPILLARY
Glucose-Capillary: 172 mg/dL — ABNORMAL HIGH (ref 70–99)
Glucose-Capillary: 174 mg/dL — ABNORMAL HIGH (ref 70–99)
Glucose-Capillary: 141 mg/dL — ABNORMAL HIGH (ref 70–99)
Glucose-Capillary: 150 mg/dL — ABNORMAL HIGH (ref 70–99)

## 2020-10-18 LAB — PARATHYROID HORMONE, INTACT (NO CA): PTH: 158 pg/mL — ABNORMAL HIGH (ref 15–65)

## 2020-10-18 MED ORDER — WARFARIN SODIUM 2 MG PO TABS
3.0000 mg | ORAL_TABLET | Freq: Once | ORAL | Status: AC
  Administered 2020-10-18: 3 mg via ORAL
  Filled 2020-10-18: qty 1

## 2020-10-18 MED ORDER — REGADENOSON 0.4 MG/5ML IV SOLN
0.4000 mg | Freq: Once | INTRAVENOUS | Status: AC
  Administered 2020-10-19: 0.4 mg via INTRAVENOUS
  Filled 2020-10-18: qty 5

## 2020-10-18 MED ORDER — INSULIN DETEMIR 100 UNIT/ML ~~LOC~~ SOLN: 8.0000 [IU] | Freq: Every day | SUBCUTANEOUS | 11 refills | Status: AC

## 2020-10-18 MED ORDER — LIDOCAINE 5 % EX PTCH
1.0000 | MEDICATED_PATCH | CUTANEOUS | Status: AC
  Administered 2020-10-18: 1 via TRANSDERMAL
  Filled 2020-10-18: qty 1

## 2020-10-18 MED ORDER — CHLORHEXIDINE GLUCONATE CLOTH 2 % EX PADS
6.0000 | MEDICATED_PAD | Freq: Every day | CUTANEOUS | Status: DC
  Administered 2020-10-19: 6 via TOPICAL

## 2020-10-18 NOTE — Progress Notes (Signed)
Patient ID: Stacey Harrell, female   DOB: 06/27/1965, 55 y.o.   MRN: [id number] Echo:  Shows moderate LVE global hypokinesis EF 30-35% and mild MR Given poor medical f/u and move to [facility] favor inpatient lexiscan Myovue to risk stratify in am   Jenkins Rouge MD [facility]

## 2020-10-18 NOTE — Progress Notes (Signed)
  Echocardiogram 2D Echocardiogram has been performed.  Stacey Harrell 10/18/2020, 11:19 AM

## 2020-10-18 NOTE — Progress Notes (Signed)
Patient states understanding of discharge instructions and that she needs to be at [facility] at 1400 today.  Paperwork given

## 2020-10-18 NOTE — Progress Notes (Signed)
Echo 7/6 showed moderate LVE global hypokinesis EF 30-35% with mild MR. Per cardiology consultation, will continue hospitalization for another night for inpatient Lexiscan/Myoview to risk stratify in the AM. If stress test is reassuring, will discharge. She is scheduled for dialysis at [facility] on Friday 7/8.   Fredrik Rigger, Medical Student

## 2020-10-18 NOTE — Progress Notes (Signed)
ANTICOAGULATION CONSULT NOTE -    Pharmacy Consult for warfarin dosing  Indication: VTE prophylaxis    Allergies  Allergen Reactions   Iodinated Diagnostic Agents Hives, Shortness Of Breath and Nausea And Vomiting    IVP   Shellfish Allergy Hives and Nausea And Vomiting    "LOBSTER ONLY" Lobster    Dulaglutide Nausea And Vomiting   Gabapentin Other (See Comments)    Other reaction(s): Delirium    Lorazepam Other (See Comments)    Other reaction(s): Hallucination       Patient Measurements: Last Weight  Most recent update: 10/17/2020  3:11 PM    Weight  119.5 kg (263 lb 7.2 oz)            Body mass index is 45.22 kg/m. Stacey Harrell               Temp: 98.3 F (36.8 C) (07/06 0501) BP: 113/61 (07/06 0501) Pulse Rate: 93 (07/06 0501)  Labs: Recent Labs    10/15/20 1510 10/16/20 0649 10/17/20 0509 10/18/20 0538  HGB 10.0* 10.5* 9.4* 9.8*  HCT 29.6* 31.4* 27.3* 29.4*  PLT 257 266 229 232  LABPROT 19.1* 21.2* 23.7* 26.0*  INR 1.6* 1.8* 2.1* 2.4*  CREATININE 12.76* 13.06* 7.09*  --      Estimated Creatinine Clearance: 11.3 mL/min (A) (by C-G formula based on SCr of 7.09 mg/dL (H)).     Medications:  Medications Prior to Admission  Medication Sig Dispense Refill Last Dose   albuterol (PROVENTIL) (5 MG/ML) 0.5% nebulizer solution Take 2.5 mg by nebulization every 6 (six) hours as needed for wheezing or shortness of breath.   PRN   albuterol (VENTOLIN HFA) 108 (90 Base) MCG/ACT inhaler Inhale 2 puffs into the lungs every 4 (four) hours as needed for wheezing or shortness of breath.   10/15/2020   Cholecalciferol 25 MCG (1000 UT) tablet Take 1,000 Units by mouth daily. Takes on Tues, Thurs,Sat   10/10/2020   insulin aspart (NOVOLOG) 100 UNIT/ML injection Inject 8 Units into the skin 3 (three) times daily before meals.   10/14/2020   insulin detemir (LEVEMIR) 100 UNIT/ML injection Inject 14 Units into the skin at bedtime.   10/14/2020   lidocaine-prilocaine (EMLA)  cream Apply 1 application topically daily. Takes on Tues, Thurs, Sat   10/10/2020   pantoprazole (PROTONIX) 40 MG tablet Take 40 mg by mouth daily.   10/14/2020   rosuvastatin (CRESTOR) 20 MG tablet Take 20 mg by mouth every 7 (seven) days.   Past Week   sevelamer carbonate (RENVELA) 800 MG tablet Take 4 tablets by mouth 3 (three) times daily.   10/14/2020   warfarin (COUMADIN) 7.5 MG tablet Take 3.5-7.5 mg by mouth daily. 3.5 mg daily T,R,S,S 7.5 mg daily M,W,F   10/14/2020 at 1000   Scheduled:   Chlorhexidine Gluconate Cloth  6 each Topical Q0600   Chlorhexidine Gluconate Cloth  6 each Topical Q0600   darbepoetin (ARANESP) injection - DIALYSIS  60 mcg Intravenous Q Tue-HD   insulin aspart  0-15 Units Subcutaneous TID WC   insulin aspart  0-5 Units Subcutaneous QHS   insulin detemir  8 Units Subcutaneous QHS   midodrine  10 mg Oral Q T,Th,Sa-HD   pantoprazole  40 mg Oral Daily   rosuvastatin  20 mg Oral Q7 days   sevelamer carbonate  3,200 mg Oral TID with meals   Warfarin - Pharmacist Dosing Inpatient   Does not apply q1600   Infusions:   sodium chloride  sodium chloride     ferric gluconate (FERRLECIT) IVPB 125 mg (10/17/20 1530)    PRN: sodium chloride, sodium chloride, albuterol, alteplase, heparin, lidocaine (PF), lidocaine-prilocaine, ondansetron **OR** ondansetron (ZOFRAN) IV, pentafluoroprop-tetrafluoroeth Anti-infectives (From admission, onward)    None       Goal of Therapy:  INR 2-3 Monitor platelets by anticoagulation protocol: Yes    Prior to Admission Warfarin Dosing:  Stacey Harrell takes 7.'5mg'$  of warfarin MWF and 3.'5mg'$  qTuThSaSun     Admit INR was 1.6 Lab Results  Component Value Date   INR 2.4 (H) 10/18/2020   INR 2.1 (H) 10/17/2020   INR 1.8 (H) 10/16/2020    Assessment: Stacey Harrell a 55 y.o. female requires anticoagulation with warfarin for the indication of  VTE prophylaxis. Warfarin will be initiated inpatient following pharmacy protocol per  pharmacy consult. Patient most recent blood work is as follows:   CBC Latest Ref Rng & Units 10/18/2020 10/17/2020 10/16/2020  WBC 4.0 - 10.5 K/uL 9.3 7.6 9.3  Hemoglobin 12.0 - 15.0 g/dL 9.8(L) 9.4(L) 10.5(L)  Hematocrit 36.0 - 46.0 % 29.4(L) 27.3(L) 31.4(L)  Platelets 150 - 400 K/uL 232 229 266     Plan: Warfarin 3 mg po x 1  Monitor CBC daily with am labs   Monitor INR daily Monitor for signs and symptoms of bleeding   Margot Ables, PharmD Clinical Pharmacist 10/18/2020 9:07 AM

## 2020-10-18 NOTE — TOC Progression Note (Signed)
Transition of Care [facility]) - Progression Note    Patient Details  Name: Stacey Harrell MRN: [id number] Date of Birth: [birth year]  Transition of Care [facility]) CM/SW Contact  Boneta Lucks, RN Phone Number: 10/18/2020, 12:36 PM  Clinical Narrative:   Bernerd Pho PA updating the team that  Dr. Johnsie Cancel wants to do a [facility] tomorrow. Discharge was cancelled. Levada Dy RN is aware she will need dialysis after testing tomorrow. TOC called [place] to cancel her appointment for today.  She is scheduled for Friday at 2:15 for first visit there. TOC to follow.  [place] Admission [phone]  EXT [phone] Fax # [phone]   Expected Discharge Plan: Home/Self Care Barriers to Discharge: Barriers Resolved  Expected Discharge Plan and Services Expected Discharge Plan: Home/Self Care       Living arrangements for the past 2 months: Single Family Home Expected Discharge Date: 10/18/20                                     Social Determinants of Health (SDOH) Interventions    Readmission Risk Interventions No flowsheet data found.

## 2020-10-18 NOTE — Progress Notes (Signed)
Patient ID: Stacey Harrell, female   DOB: Feb 27, 1966, 55 y.o.   MRN: [id number]    Subjective:  Breathing better able to lay flat  No chest "tightness"  Objective:  Vitals:   10/17/20 1745 10/17/20 2038 10/18/20 0135 10/18/20 0501  BP: 102/65 104/65 109/66 113/61  Pulse: 95 94 92 93  Resp: '18 19  19  '$ Temp:  98.2 F (36.8 C)  98.3 F (36.8 C)  TempSrc:      SpO2: 95% 92% 91% 90%  Weight:      Height:        Intake/Output from previous day:  Intake/Output Summary (Last 24 hours) at 10/18/2020 0812 Last data filed at 10/18/2020 0400 Gross per 24 hour  Intake 710 ml  Output 926 ml  Net -216 ml    Physical Exam: Obese black female Blind Basilar atelectasis  Functioning fistula RUE Non functioning fistula LUE Abdomen soft BS positive Trace edema No murmur  PT palpable bilaterally   Lab Results: Basic Metabolic Panel: Recent Labs    10/16/20 0649 10/17/20 0509  NA 136 132*  K 6.3* 4.5  CL 95* 92*  CO2 25 28  GLUCOSE 205* 159*  BUN 103* 46*  CREATININE 13.06* 7.09*  CALCIUM 9.0 8.3*  PHOS  --  5.3*   Liver Function Tests: Recent Labs    10/17/20 0509  ALBUMIN 2.6*   No results for input(s): LIPASE, AMYLASE in the last 72 hours. CBC: Recent Labs    10/17/20 0509 10/18/20 0538  WBC 7.6 9.3  HGB 9.4* 9.8*  HCT 27.3* 29.4*  MCV 89.2 90.2  PLT 229 232    Hemoglobin A1C: Recent Labs    10/15/20 1510  HGBA1C 7.6*    Anemia Panel: Recent Labs    10/17/20 0509  FERRITIN 822*  TIBC 241*  IRON 55    Imaging: Imaging results have been reviewed  Cardiac Studies:  ECG: SR LBBB    Telemetry:NSR 10/18/2020   Echo: pending   Medications:    Chlorhexidine Gluconate Cloth  6 each Topical Q0600   Chlorhexidine Gluconate Cloth  6 each Topical Q0600   darbepoetin (ARANESP) injection - DIALYSIS  60 mcg Intravenous Q Tue-HD   insulin aspart  0-15 Units Subcutaneous TID WC   insulin aspart  0-5 Units Subcutaneous QHS   insulin detemir  8 Units  Subcutaneous QHS   midodrine  10 mg Oral Q T,Th,Sa-HD   pantoprazole  40 mg Oral Daily   rosuvastatin  20 mg Oral Q7 days   sevelamer carbonate  3,200 mg Oral TID with meals   Warfarin - Pharmacist Dosing Inpatient   Does not apply q1600      sodium chloride     sodium chloride     ferric gluconate (FERRLECIT) IVPB 125 mg (10/17/20 1530)    Assessment/Plan:   Chest Pain:  ? Pericardial or GI troponin negative x 3 ECG LBBB echo pending Echo pending ok to d/c if EF normal and no large pericardial effusion from uremia  CRF:  continue dialysis needs to establish with [facility] in [place] missed many session with move from [place]  Hyperkalemia:  rhythm stable has had Lokema K 4.5  HLD:  continue statin  DM:  continue insulin A1c  7.6 poor control  DVT:  history of on coumadin INR Rx 2.4   Cardiology will sign off Outpatient f/u in [facility] 10/18/2020, 8:12 AM

## 2020-10-18 NOTE — Progress Notes (Signed)
[facility] night shift.  The staff reported that the patient requested to have a Lidoderm patch for her shoulder pain.  Lidoderm x1 dose ordered.  Tennis Must, MD.

## 2020-10-18 NOTE — Care Management Important Message (Signed)
Important Message  Patient Details  Name: Stacey Harrell MRN: [id number] Date of Birth: 11-06-1965   Medicare Important Message Given:  Yes     Tommy Medal 10/18/2020, 12:36 PM

## 2020-10-18 NOTE — Progress Notes (Signed)
[facility] ROUNDING NOTE   Subjective:   Interval History: Pt is a 55 y.o. yo female  with PMH insulin-dependent diabetes, hypertension, peripheral vascular disease a status post right BKA asthma, ESRD on HD TTS who presented with chest tightness and shortness of breath, seen as a consultation for the management of dialysis. Patient receives dialysis in [place] and now moved to [place] without establishing outpatient dialysis.   Last dialysis 7/5   1 L removed   next dialysis 7/7    BP 113/61  P 77   sats 90 %     Hb 9.8  Na 132  K  4.5   Cl 92  CO2 98  Glc 159  BUN 46 Cr 7   Ca 8.3  Phos 5.3   Aranesp 60 mcg  weekly,  insulin, midodrine, protonix, crestor, renvela, coumadin   Objective:  Vital signs in last 24 hours:  Temp:  [97.8 F (36.6 C)-98.3 F (36.8 C)] 98.3 F (36.8 C) (07/06 0501) Pulse Rate:  [80-95] 93 (07/06 0501) Resp:  [14-19] 19 (07/06 0501) BP: (91-113)/(54-66) 113/61 (07/06 0501) SpO2:  [90 %-95 %] 90 % (07/06 0501) Weight:  [119.5 kg] 119.5 kg (07/05 1435)  Weight change: 1.4 kg Filed Weights   10/16/20 0532 10/16/20 1310 10/17/20 1435  Weight: 118.1 kg 118.1 kg 119.5 kg    Intake/Output: I/O last 3 completed shifts: In: 710 [P.O.:600; IV Piggyback:110] Out: 926 [Other:926]   Intake/Output this shift:  Total I/O In: 240 [P.O.:240] Out: -   CVS- RRR RS- CTAGeneral:NAD, comfortable Heart:RRR, s1s2 nl Lungs:clear b/l, no crackle Abdomen:soft, Non-tender, non-distended Extremities: Right BKA, left leg has trace pitting edema. Dialysis Access: Right upper extremity AV fistula has good thrill and bruit. ABD- BS present soft non-distended EXT- no edema   Basic Metabolic Panel: Recent Labs  Lab 10/15/20 1510 10/16/20 0649 10/17/20 0509  NA 133* 136 132*  K 6.0* 6.3* 4.5  CL 94* 95* 92*  CO2 '24 25 28  '$ GLUCOSE 287* 205* 159*  BUN 103* 103* 46*  CREATININE 12.76* 13.06* 7.09*  CALCIUM 8.8* 9.0 8.3*  PHOS  --   --   5.3*    Liver Function Tests: Recent Labs  Lab 10/17/20 0509  ALBUMIN 2.6*   No results for input(s): LIPASE, AMYLASE in the last 168 hours. No results for input(s): AMMONIA in the last 168 hours.  CBC: Recent Labs  Lab 10/15/20 1510 10/16/20 0649 10/17/20 0509 10/18/20 0538  WBC 9.7 9.3 7.6 9.3  HGB 10.0* 10.5* 9.4* 9.8*  HCT 29.6* 31.4* 27.3* 29.4*  MCV 88.4 88.5 89.2 90.2  PLT 257 266 229 232    Cardiac Enzymes: No results for input(s): CKTOTAL, CKMB, CKMBINDEX, TROPONINI in the last 168 hours.  BNP: Invalid input(s): POCBNP  CBG: Recent Labs  Lab 10/17/20 0720 10/17/20 1112 10/17/20 1846 10/17/20 2039 10/18/20 0740  GLUCAP 138* 170* 140* 204* 172*    Microbiology: Results for orders placed or performed during the hospital encounter of 10/15/20  Resp Panel by RT-PCR (Flu A&B, Covid) Nasopharyngeal Swab     Status: None   Collection Time: 10/15/20  4:22 PM   Specimen: Nasopharyngeal Swab; Nasopharyngeal(NP) swabs in vial transport medium  Result Value Ref Range Status   SARS Coronavirus 2 by RT PCR NEGATIVE NEGATIVE Final    Comment: (NOTE) SARS-CoV-2 target nucleic acids are NOT DETECTED.  The SARS-CoV-2 RNA is generally detectable in upper respiratory specimens during the acute phase of infection. The lowest  concentration of SARS-CoV-2 viral copies this assay can detect is 138 copies/mL. A negative result does not preclude SARS-Cov-2 infection and should not be used as the sole basis for treatment or other patient management decisions. A negative result may occur with  improper specimen collection/handling, submission of specimen other than nasopharyngeal swab, presence of viral mutation(s) within the areas targeted by this assay, and inadequate number of viral copies(<138 copies/mL). A negative result must be combined with clinical observations, patient history, and epidemiological information. The expected result is Negative.  Fact Sheet for  Patients:  EntrepreneurPulse.com.au  Fact Sheet for Healthcare Providers:  IncredibleEmployment.be  This test is no t yet approved or cleared by the [place] FDA and  has been authorized for detection and/or diagnosis of SARS-CoV-2 by FDA under an Emergency Use Authorization (EUA). This EUA will remain  in effect (meaning this test can be used) for the duration of the COVID-19 declaration under Section 564(b)(1) of the Act, 21 U.S.C.section 360bbb-3(b)(1), unless the authorization is terminated  or revoked sooner.       Influenza A by PCR NEGATIVE NEGATIVE Final   Influenza B by PCR NEGATIVE NEGATIVE Final    Comment: (NOTE) The Xpert Xpress SARS-CoV-2/FLU/RSV plus assay is intended as an aid in the diagnosis of influenza from Nasopharyngeal swab specimens and should not be used as a sole basis for treatment. Nasal washings and aspirates are unacceptable for Xpert Xpress SARS-CoV-2/FLU/RSV testing.  Fact Sheet for Patients: EntrepreneurPulse.com.au  Fact Sheet for Healthcare Providers: IncredibleEmployment.be  This test is not yet approved or cleared by the [place] FDA and has been authorized for detection and/or diagnosis of SARS-CoV-2 by FDA under an Emergency Use Authorization (EUA). This EUA will remain in effect (meaning this test can be used) for the duration of the COVID-19 declaration under Section 564(b)(1) of the Act, 21 U.S.C. section 360bbb-3(b)(1), unless the authorization is terminated or revoked.  Performed at [facility], [address] [place], [place] [address]     Coagulation Studies: Recent Labs    10/15/20 1510 10/16/20 0649 10/17/20 0509 10/18/20 0538  LABPROT 19.1* 21.2* 23.7* 26.0*  INR 1.6* 1.8* 2.1* 2.4*    Urinalysis: No results for input(s): COLORURINE, LABSPEC, PHURINE, GLUCOSEU, HGBUR, BILIRUBINUR, KETONESUR, PROTEINUR, UROBILINOGEN, NITRITE,  LEUKOCYTESUR in the last 72 hours.  Invalid input(s): APPERANCEUR    Imaging: No results found.   Medications:    sodium chloride     sodium chloride     ferric gluconate (FERRLECIT) IVPB 125 mg (10/17/20 1530)    Chlorhexidine Gluconate Cloth  6 each Topical Q0600   Chlorhexidine Gluconate Cloth  6 each Topical Q0600   darbepoetin (ARANESP) injection - DIALYSIS  60 mcg Intravenous Q Tue-HD   insulin aspart  0-15 Units Subcutaneous TID WC   insulin aspart  0-5 Units Subcutaneous QHS   insulin detemir  8 Units Subcutaneous QHS   midodrine  10 mg Oral Q T,Th,Sa-HD   pantoprazole  40 mg Oral Daily   rosuvastatin  20 mg Oral Q7 days   sevelamer carbonate  3,200 mg Oral TID with meals   warfarin  3 mg Oral ONCE-1600   Warfarin - Pharmacist Dosing Inpatient   Does not apply q1600   sodium chloride, sodium chloride, albuterol, alteplase, heparin, lidocaine (PF), lidocaine-prilocaine, ondansetron **OR** ondansetron (ZOFRAN) IV, pentafluoroprop-tetrafluoroeth  Assessment/ Plan:  1. ESRD: Was TTS schedule in [place] and now moved to [place] without setting up outpatient dialysis.  Reportedly, the paperworks are underway in  [facility] but no chair time or schedule yet.  She now presents with uremia, hyperkalemia and fluid overload.  last dialysis 7/5  926cc removed  next dialysis 10/19/2020 2. Hypotension  chronic midodrine 3.Hyperkalemia: Improved with dialysis.   4.Hypertension: Blood pressure acceptable.  Ultrafiltration with dialysis.  Midodrine for intradialytic hypotension 5.Anemia of ESRD: Hemoglobin below goal, iron saturation 23%.  I will order IV iron and ESA. 5.Metabolic Bone Disease: Phosphorus level and calcium at goal  6.Acute metabolic encephalopathy/uremic: Mental status improving.    LOS: 3 Sherril Croon '@TODAY''@10'$ :24 AM

## 2020-10-19 ENCOUNTER — Encounter (HOSPITAL_COMMUNITY): Payer: Self-pay | Admitting: Family Medicine

## 2020-10-19 ENCOUNTER — Inpatient Hospital Stay (HOSPITAL_COMMUNITY): Payer: Medicare HMO

## 2020-10-19 DIAGNOSIS — R0789 Other chest pain: Secondary | ICD-10-CM

## 2020-10-19 DIAGNOSIS — I953 Hypotension of hemodialysis: Secondary | ICD-10-CM

## 2020-10-19 LAB — NM MYOCAR MULTI W/SPECT W/WALL MOTION / EF
LV dias vol: 126 mL (ref 46–106)
LV sys vol: 85 mL
Peak HR: 97 {beats}/min
RATE: 0.41
Rest HR: 95 {beats}/min
SDS: 11
SRS: 8
SSS: 19
TID: 0.89

## 2020-10-19 LAB — CBC
HCT: 27.7 % — ABNORMAL LOW (ref 36.0–46.0)
Hemoglobin: 9.4 g/dL — ABNORMAL LOW (ref 12.0–15.0)
MCH: 30 pg (ref 26.0–34.0)
MCHC: 33.9 g/dL (ref 30.0–36.0)
MCV: 88.5 fL (ref 80.0–100.0)
Platelets: 238 10*3/uL (ref 150–400)
RBC: 3.13 MIL/uL — ABNORMAL LOW (ref 3.87–5.11)
RDW: 14.9 % (ref 11.5–15.5)
WBC: 8.4 10*3/uL (ref 4.0–10.5)
nRBC: 0 % (ref 0.0–0.2)

## 2020-10-19 LAB — GLUCOSE, CAPILLARY: Glucose-Capillary: 141 mg/dL — ABNORMAL HIGH (ref 70–99)

## 2020-10-19 LAB — PROTIME-INR
INR: 2.8 — ABNORMAL HIGH (ref 0.8–1.2)
Prothrombin Time: 29.4 seconds — ABNORMAL HIGH (ref 11.4–15.2)

## 2020-10-19 MED ORDER — TECHNETIUM TC 99M TETROFOSMIN IV KIT
30.0000 | PACK | Freq: Once | INTRAVENOUS | Status: AC | PRN
  Administered 2020-10-19: 31 via INTRAVENOUS

## 2020-10-19 MED ORDER — REGADENOSON 0.4 MG/5ML IV SOLN
INTRAVENOUS | Status: AC
  Filled 2020-10-19: qty 5

## 2020-10-19 MED ORDER — WARFARIN SODIUM 2 MG PO TABS: 3.0000 mg | ORAL_TABLET | Freq: Once | ORAL | Status: DC

## 2020-10-19 MED ORDER — SODIUM CHLORIDE FLUSH 0.9 % IV SOLN
INTRAVENOUS | Status: AC
  Administered 2020-10-19: 10 mL via INTRAVENOUS
  Filled 2020-10-19: qty 10

## 2020-10-19 MED ORDER — TECHNETIUM TC 99M TETROFOSMIN IV KIT
10.0000 | PACK | Freq: Once | INTRAVENOUS | Status: AC | PRN
  Administered 2020-10-19: 10.2 via INTRAVENOUS

## 2020-10-19 NOTE — TOC Transition Note (Signed)
Transition of Care [facility]) - CM/SW Discharge Note   Patient Details  Name: Stacey Harrell MRN: [id number] Date of Birth: November 18, 1965  Transition of Care [facility] CM/SW Contact:  Salome Arnt, [place] Phone Number: 10/19/2020, 4:15 PM   Clinical Narrative: Pt will d/c today after dialysis. LCSW confirmed with [facility] admissions that pt will start on Friday 7/8 and needs to arrive at 2:15 for paperwork. Information on AVS and pt states she has transportation.       Final next level of care: Home/Self Care Barriers to Discharge: Barriers Resolved   Patient Goals and CMS Choice Patient states their goals for this hospitalization and ongoing recovery are:: to return home. CMS Medicare.gov Compare Post Acute Care list provided to:: Patient Choice offered to / list presented to : Patient  Discharge Placement                Patient to be transferred to facility by: Boy Friend   Patient and family notified of of transfer: 10/17/20  Discharge Plan and Services                                     Social Determinants of Health (SDOH) Interventions     Readmission Risk Interventions No flowsheet data found.

## 2020-10-19 NOTE — Progress Notes (Signed)
10/19/2020 6:10 PM  Pt will discharge after HD today.  Please see DC summary.   Murvin Natal MD How to contact the [facility] Attending or Consulting provider [place] or covering provider during after hours [place], for this patient?  Check the care team in [facility] and look for a) attending/consulting TRH provider listed and b) the [facility] team listed Log into [email or web address] and use [place]'s universal password to access. If you do not have the password, please contact the hospital operator. Locate the [facility] provider you are looking for under [facility] and page to a number that you can be directly reached. If you still have difficulty reaching the provider, please page the [facility] (Director on Call) for the Hospitalists listed on amion for assistance.

## 2020-10-19 NOTE — Procedures (Signed)
   HEMODIALYSIS TREATMENT NOTE:  Uneventful 3.5 hour heparin-free HD completed via right upper arm AVF (16g/antegrade). Goal met: 1 liter removed without interruption in UF.  All blood was returned and hemostasis was achieved in 15 minutes.  No changes fro pre-HD assessment.  Rockwell Alexandria, RN

## 2020-10-19 NOTE — Progress Notes (Signed)
Patient back from hemo. D/c papers given and discussed with patient and husband.

## 2020-10-19 NOTE — Discharge Instructions (Signed)
IMPORTANT INFORMATION: PAY CLOSE ATTENTION   PHYSICIAN DISCHARGE INSTRUCTIONS  Follow with Primary care provider and other consultants as instructed by your Hospitalist Physician  [place] IF SYMPTOMS COME BACK, WORSEN OR NEW PROBLEM DEVELOPS   Please note: You were cared for by a hospitalist during your hospital stay. Every effort will be made to forward records to your primary care provider.  You can request that your primary care provider send for your hospital records if they have not received them.  Once you are discharged, your primary care physician will handle any further medical issues. Please note that NO REFILLS for any discharge medications will be authorized once you are discharged, as it is imperative that you return to your primary care physician (or establish a relationship with a primary care physician if you do not have one) for your post hospital discharge needs so that they can reassess your need for medications and monitor your lab values.  Please get a complete blood count and chemistry panel checked by your Primary MD at your next visit, and again as instructed by your Primary MD.  Get Medicines reviewed and adjusted: Please take all your medications with you for your next visit with your Primary MD  Laboratory/radiological data: Please request your Primary MD to go over all hospital tests and procedure/radiological results at the follow up, please ask your primary care provider to get all Hospital records sent to his/her office.  In some cases, they will be blood work, cultures and biopsy results pending at the time of your discharge. Please request that your primary care provider follow up on these results.  If you are diabetic, please bring your blood sugar readings with you to your follow up appointment with primary care.    Please call and make your follow up appointments as soon as possible.    Also Note the following: If you  experience worsening of your admission symptoms, develop shortness of breath, life threatening emergency, suicidal or homicidal thoughts you must seek medical attention immediately by calling 911 or calling your MD immediately  if symptoms less severe.  You must read complete instructions/literature along with all the possible adverse reactions/side effects for all the Medicines you take and that have been prescribed to you. Take any new Medicines after you have completely understood and accpet all the possible adverse reactions/side effects.   Do not drive when taking Pain medications or sleeping medications (Benzodiazepines)  Do not take more than prescribed Pain, Sleep and Anxiety Medications. It is not advisable to combine anxiety,sleep and pain medications without talking with your primary care practitioner  Special Instructions: If you have smoked or chewed Tobacco  in the last 2 yrs please stop smoking, stop any regular Alcohol  and or any Recreational drug use.  Wear Seat belts while driving.  Do not drive if taking any narcotic, mind altering or controlled substances or recreational drugs or alcohol.

## 2020-10-19 NOTE — Progress Notes (Signed)
[facility] ROUNDING NOTE   Subjective:   Interval History: Pt is a 55 y.o. yo female  with PMH insulin-dependent diabetes, hypertension, peripheral vascular disease a status post right BKA asthma, ESRD on HD TTS who presented with chest tightness and shortness of breath, seen as a consultation for the management of dialysis. Patient receives dialysis in [place] and now moved to [place] without establishing outpatient dialysis.   Last dialysis 7/5   1 L removed   next dialysis 7/7    Blood pressure 150/ 60 pulse 87 temperature 98.3 O2 sats 94% room air  Hb 9.8  Na 132  K  4.5   Cl 92  CO2 98  Glc 159  BUN 46 Cr 7   Ca 8.3  Phos 5.3   Aranesp 60 mcg  weekly,  insulin, midodrine, protonix, crestor, renvela, coumadin   Objective:  Vital signs in last 24 hours:  Temp:  [97.8 F (36.6 C)-98.3 F (36.8 C)] 98.3 F (36.8 C) (07/07 0452) Pulse Rate:  [85-93] 87 (07/07 0452) Resp:  [18-20] 20 (07/07 0452) BP: (95-115)/(62-69) 115/66 (07/07 0452) SpO2:  [94 %-98 %] 94 % (07/07 0452)  Weight change:  Filed Weights   10/16/20 0532 10/16/20 1310 10/17/20 1435  Weight: 118.1 kg 118.1 kg 119.5 kg    Intake/Output: I/O last 3 completed shifts: In: 840 [P.O.:730; IV Piggyback:110] Out: -    Intake/Output this shift:  No intake/output data recorded.  CVS- RRR RS- CTAGeneral:NAD, comfortable Heart:RRR, s1s2 nl Lungs:clear b/l, no crackle Abdomen:soft, Non-tender, non-distended Extremities: Right BKA, left leg has trace pitting edema. Dialysis Access: Right upper extremity AV fistula has good thrill and bruit. ABD- BS present soft non-distended EXT- no edema   Basic Metabolic Panel: Recent Labs  Lab 10/15/20 1510 10/16/20 0649 10/17/20 0509  NA 133* 136 132*  K 6.0* 6.3* 4.5  CL 94* 95* 92*  CO2 '24 25 28  '$ GLUCOSE 287* 205* 159*  BUN 103* 103* 46*  CREATININE 12.76* 13.06* 7.09*  CALCIUM 8.8* 9.0 8.3*  PHOS  --   --  5.3*     Liver Function  Tests: Recent Labs  Lab 10/17/20 0509  ALBUMIN 2.6*    No results for input(s): LIPASE, AMYLASE in the last 168 hours. No results for input(s): AMMONIA in the last 168 hours.  CBC: Recent Labs  Lab 10/15/20 1510 10/16/20 0649 10/17/20 0509 10/18/20 0538 10/19/20 0554  WBC 9.7 9.3 7.6 9.3 8.4  HGB 10.0* 10.5* 9.4* 9.8* 9.4*  HCT 29.6* 31.4* 27.3* 29.4* 27.7*  MCV 88.4 88.5 89.2 90.2 88.5  PLT 257 266 229 232 238     Cardiac Enzymes: No results for input(s): CKTOTAL, CKMB, CKMBINDEX, TROPONINI in the last 168 hours.  BNP: Invalid input(s): POCBNP  CBG: Recent Labs  Lab 10/18/20 0740 10/18/20 1113 10/18/20 1641 10/18/20 2202 10/19/20 0735  GLUCAP 172* 174* 141* 150* 141*     Microbiology: Results for orders placed or performed during the hospital encounter of 10/15/20  Resp Panel by RT-PCR (Flu A&B, Covid) Nasopharyngeal Swab     Status: None   Collection Time: 10/15/20  4:22 PM   Specimen: Nasopharyngeal Swab; Nasopharyngeal(NP) swabs in vial transport medium  Result Value Ref Range Status   SARS Coronavirus 2 by RT PCR NEGATIVE NEGATIVE Final    Comment: (NOTE) SARS-CoV-2 target nucleic acids are NOT DETECTED.  The SARS-CoV-2 RNA is generally detectable in upper respiratory specimens during the acute phase of infection. The lowest concentration of  SARS-CoV-2 viral copies this assay can detect is 138 copies/mL. A negative result does not preclude SARS-Cov-2 infection and should not be used as the sole basis for treatment or other patient management decisions. A negative result may occur with  improper specimen collection/handling, submission of specimen other than nasopharyngeal swab, presence of viral mutation(s) within the areas targeted by this assay, and inadequate number of viral copies(<138 copies/mL). A negative result must be combined with clinical observations, patient history, and epidemiological information. The expected result is  Negative.  Fact Sheet for Patients:  EntrepreneurPulse.com.au  Fact Sheet for Healthcare Providers:  IncredibleEmployment.be  This test is no t yet approved or cleared by the [place] FDA and  has been authorized for detection and/or diagnosis of SARS-CoV-2 by FDA under an Emergency Use Authorization (EUA). This EUA will remain  in effect (meaning this test can be used) for the duration of the COVID-19 declaration under Section 564(b)(1) of the Act, 21 U.S.C.section 360bbb-3(b)(1), unless the authorization is terminated  or revoked sooner.       Influenza A by PCR NEGATIVE NEGATIVE Final   Influenza B by PCR NEGATIVE NEGATIVE Final    Comment: (NOTE) The Xpert Xpress SARS-CoV-2/FLU/RSV plus assay is intended as an aid in the diagnosis of influenza from Nasopharyngeal swab specimens and should not be used as a sole basis for treatment. Nasal washings and aspirates are unacceptable for Xpert Xpress SARS-CoV-2/FLU/RSV testing.  Fact Sheet for Patients: EntrepreneurPulse.com.au  Fact Sheet for Healthcare Providers: IncredibleEmployment.be  This test is not yet approved or cleared by the [place] FDA and has been authorized for detection and/or diagnosis of SARS-CoV-2 by FDA under an Emergency Use Authorization (EUA). This EUA will remain in effect (meaning this test can be used) for the duration of the COVID-19 declaration under Section 564(b)(1) of the Act, 21 U.S.C. section 360bbb-3(b)(1), unless the authorization is terminated or revoked.  Performed at [facility], [address] [place], [place] [address]     Coagulation Studies: Recent Labs    10/17/20 0509 10/18/20 0538 10/19/20 0554  LABPROT 23.7* 26.0* 29.4*  INR 2.1* 2.4* 2.8*     Urinalysis: No results for input(s): COLORURINE, LABSPEC, PHURINE, GLUCOSEU, HGBUR, BILIRUBINUR, KETONESUR, PROTEINUR, UROBILINOGEN, NITRITE,  LEUKOCYTESUR in the last 72 hours.  Invalid input(s): APPERANCEUR    Imaging: ECHOCARDIOGRAM COMPLETE  Result Date: 10/18/2020    ECHOCARDIOGRAM REPORT   Patient Name:   DETTE BRIGNAC Date of Exam: 10/18/2020 Medical Rec #:  [id number]      Height:       64.0 in Accession #:    [id number]     Weight:       263.4 lb Date of Birth:  Apr 30, 1965      BSA:          2.199 m Patient Age:    88 years       BP:           113/61 mmHg Patient Gender: F              HR:           93 bpm. Exam Location:  [facility] Procedure: 2D Echo, Cardiac Doppler and Color Doppler Indications:    Chest Pain  History:        Patient has no prior history of Echocardiogram examinations.                 CHF, Stroke, Arrythmias:LBBB; Risk Factors:Diabetes.  Sonographer:    Earlie Server  Jenean Lindau Referring Phys: [id number] DAVID TAT IMPRESSIONS  1. Left ventricular ejection fraction, by estimation, is 30 to 35%. The left ventricle has moderately decreased function. The left ventricle demonstrates global hypokinesis. The left ventricular internal cavity size was moderately dilated. Left ventricular diastolic parameters were normal.  2. Right ventricular systolic function is normal. The right ventricular size is normal. There is moderately elevated pulmonary artery systolic pressure.  3. The mitral valve is abnormal. Mild mitral valve regurgitation. No evidence of mitral stenosis. Moderate mitral annular calcification.  4. Calcified non coronary cusp. The aortic valve is tricuspid. There is moderate calcification of the aortic valve. Aortic valve regurgitation is not visualized. Mild to moderate aortic valve sclerosis/calcification is present, without any evidence of aortic stenosis.  5. The inferior vena cava is dilated in size with >50% respiratory variability, suggesting right atrial pressure of 8 mmHg. FINDINGS  Left Ventricle: Left ventricular ejection fraction, by estimation, is 30 to 35%. The left ventricle has moderately decreased function. The  left ventricle demonstrates global hypokinesis. The left ventricular internal cavity size was moderately dilated. There is no left ventricular hypertrophy. Left ventricular diastolic parameters were normal. Right Ventricle: The right ventricular size is normal. No increase in right ventricular wall thickness. Right ventricular systolic function is normal. There is moderately elevated pulmonary artery systolic pressure. The tricuspid regurgitant velocity is 3.17 m/s, and with an assumed right atrial pressure of 8 mmHg, the estimated right ventricular systolic pressure is [id number] mmHg. Left Atrium: Left atrial size was normal in size. Right Atrium: Right atrial size was normal in size. Pericardium: There is no evidence of pericardial effusion. Mitral Valve: The mitral valve is abnormal. There is mild thickening of the mitral valve leaflet(s). There is mild calcification of the mitral valve leaflet(s). Moderate mitral annular calcification. Mild mitral valve regurgitation. No evidence of mitral  valve stenosis. MV peak gradient, 8.1 mmHg. The mean mitral valve gradient is 2.0 mmHg. Tricuspid Valve: The tricuspid valve is normal in structure. Tricuspid valve regurgitation is not demonstrated. No evidence of tricuspid stenosis. Aortic Valve: Calcified non coronary cusp. The aortic valve is tricuspid. There is moderate calcification of the aortic valve. Aortic valve regurgitation is not visualized. Mild to moderate aortic valve sclerosis/calcification is present, without any evidence of aortic stenosis. Aortic valve mean gradient measures 4.0 mmHg. Aortic valve peak gradient measures 7.7 mmHg. Aortic valve area, by VTI measures 1.94 cm. Pulmonic Valve: The pulmonic valve was normal in structure. Pulmonic valve regurgitation is not visualized. No evidence of pulmonic stenosis. Aorta: The aortic root is normal in size and structure. Venous: The inferior vena cava is dilated in size with greater than 50% respiratory  variability, suggesting right atrial pressure of 8 mmHg. IAS/Shunts: No atrial level shunt detected by color flow Doppler.  LEFT VENTRICLE PLAX 2D LVIDd:         5.46 cm     Diastology LVIDs:         4.12 cm     LV e' medial:    7.13 cm/s LV PW:         0.91 cm     LV E/e' medial:  17.4 LV IVS:        0.87 cm     LV e' lateral:   10.40 cm/s LVOT diam:     1.90 cm     LV E/e' lateral: 11.9 LV SV:         51 LV SV Index:   23 LVOT Area:  2.84 cm  LV Volumes (MOD) LV vol d, MOD A2C: 98.3 ml LV vol d, MOD A4C: 85.8 ml LV vol s, MOD A2C: 72.7 ml LV vol s, MOD A4C: 61.9 ml LV SV MOD A2C:     25.6 ml LV SV MOD A4C:     85.8 ml LV SV MOD BP:      22.9 ml RIGHT VENTRICLE RV Basal diam:  3.30 cm RV S prime:     9.45 cm/s TAPSE (M-mode): 1.9 cm LEFT ATRIUM             Index       RIGHT ATRIUM           Index LA diam:        3.90 cm 1.77 cm/m  RA Area:     14.50 cm LA Vol (A2C):   51.6 ml 23.46 ml/m RA Volume:   35.20 ml  16.00 ml/m LA Vol (A4C):   76.3 ml 34.69 ml/m LA Biplane Vol: 66.0 ml 30.01 ml/m  AORTIC VALVE AV Area (Vmax):    1.93 cm AV Area (Vmean):   1.69 cm AV Area (VTI):     1.94 cm AV Vmax:           139.00 cm/s AV Vmean:          92.700 cm/s AV VTI:            0.264 m AV Peak Grad:      7.7 mmHg AV Mean Grad:      4.0 mmHg LVOT Vmax:         94.60 cm/s LVOT Vmean:        55.400 cm/s LVOT VTI:          0.181 m LVOT/AV VTI ratio: 0.69 MITRAL VALVE                TRICUSPID VALVE MV Area (PHT): 7.44 cm     TR Peak grad:   40.2 mmHg MV Area VTI:   2.48 cm     TR Vmax:        317.00 cm/s MV Peak grad:  8.1 mmHg MV Mean grad:  2.0 mmHg     SHUNTS MV Vmax:       1.42 m/s     Systemic VTI:  0.18 m MV Vmean:      60.8 cm/s    Systemic Diam: 1.90 cm MV Decel Time: 102 msec MV E velocity: 124.00 cm/s MV A velocity: 73.80 cm/s MV E/A ratio:  1.68 Jenkins Rouge MD Electronically signed by Jenkins Rouge MD Signature Date/Time: 10/18/2020/11:53:15 AM    Final      Medications:    sodium chloride     sodium  chloride     ferric gluconate (FERRLECIT) IVPB 125 mg (10/17/20 1530)    Chlorhexidine Gluconate Cloth  6 each Topical Q0600   Chlorhexidine Gluconate Cloth  6 each Topical Q0600   Chlorhexidine Gluconate Cloth  6 each Topical Q0600   darbepoetin (ARANESP) injection - DIALYSIS  60 mcg Intravenous Q Tue-HD   insulin aspart  0-15 Units Subcutaneous TID WC   insulin aspart  0-5 Units Subcutaneous QHS   insulin detemir  8 Units Subcutaneous QHS   lidocaine  1 patch Transdermal Q24H   midodrine  10 mg Oral Q T,Th,Sa-HD   pantoprazole  40 mg Oral Daily   regadenoson  0.4 mg Intravenous Once   rosuvastatin  20 mg Oral Q7 days   sevelamer carbonate  3,200 mg Oral TID with meals   Warfarin - Pharmacist Dosing Inpatient   Does not apply q1600   sodium chloride, sodium chloride, albuterol, alteplase, heparin, lidocaine (PF), lidocaine-prilocaine, ondansetron **OR** ondansetron (ZOFRAN) IV, pentafluoroprop-tetrafluoroeth  Assessment/ Plan:  1. ESRD: Was TTS schedule in [place] and now moved to [place] without setting up outpatient dialysis.  Reportedly, the paperworks are underway in [place] but no chair time or schedule yet.  She now presents with uremia, hyperkalemia and fluid overload.  last dialysis 7/5  926cc removed  next dialysis 10/19/2020 2. Hypotension  chronic midodrine 3.Hyperkalemia: Improved with dialysis.   4.Hypertension: Blood pressure acceptable.  Ultrafiltration with dialysis.  Midodrine for intradialytic hypotension 5.Anemia of ESRD: Hemoglobin below goal, iron saturation 23%.  I will order IV iron and ESA. 5.Metabolic Bone Disease: Phosphorus level and calcium at goal  6.Acute metabolic encephalopathy/uremic: Mental status improving.    LOS: [place] '@TODAY''@9'$ :20 AM

## 2020-10-19 NOTE — Progress Notes (Addendum)
Progress Note  Patient Name: Stacey Harrell Date of Encounter: 10/19/2020  [facility] Cardiologist: Previously lived in [place], now living in [place], [place]  Subjective   Reports intermittent episodes of chest tightness at rest, lasting for less than a few minutes then resolving. Has been occurring for several years per her report. No associated dyspnea, diaphoresis or nausea. Requesting food this AM as she is currently NPO for her pending stress test.   Inpatient Medications    Scheduled Meds:  Chlorhexidine Gluconate Cloth  6 each Topical Q0600   Chlorhexidine Gluconate Cloth  6 each Topical Q0600   Chlorhexidine Gluconate Cloth  6 each Topical Q0600   darbepoetin (ARANESP) injection - DIALYSIS  60 mcg Intravenous Q Tue-HD   insulin aspart  0-15 Units Subcutaneous TID WC   insulin aspart  0-5 Units Subcutaneous QHS   insulin detemir  8 Units Subcutaneous QHS   lidocaine  1 patch Transdermal Q24H   midodrine  10 mg Oral Q T,Th,Sa-HD   pantoprazole  40 mg Oral Daily   regadenoson  0.4 mg Intravenous Once   rosuvastatin  20 mg Oral Q7 days   sevelamer carbonate  3,200 mg Oral TID with meals   Warfarin - Pharmacist Dosing Inpatient   Does not apply q1600   Continuous Infusions:  sodium chloride     sodium chloride     ferric gluconate (FERRLECIT) IVPB 125 mg (10/17/20 1530)   PRN Meds: sodium chloride, sodium chloride, albuterol, alteplase, heparin, lidocaine (PF), lidocaine-prilocaine, ondansetron **OR** ondansetron (ZOFRAN) IV, pentafluoroprop-tetrafluoroeth   Vital Signs    Vitals:   10/18/20 0501 10/18/20 1205 10/18/20 2202 10/19/20 0452  BP: 113/61 107/69 95/62 115/66  Pulse: 93 93 85 87  Resp: '19 19 18 20  '$ Temp: 98.3 F (36.8 C) 97.8 F (36.6 C) 98.2 F (36.8 C) 98.3 F (36.8 C)  TempSrc:  Oral    SpO2: 90% 98% 95% 94%  Weight:      Height:        Intake/Output Summary (Last 24 hours) at 10/19/2020 0742 Last data filed at 10/18/2020 2202 Gross per 24  hour  Intake 730 ml  Output --  Net 730 ml   Last 3 Weights 10/17/2020 10/16/2020 10/16/2020  Weight (lbs) 263 lb 7.2 oz 260 lb 5.8 oz 260 lb 5.8 oz  Weight (kg) 119.5 kg 118.1 kg 118.1 kg      Telemetry    NSR, HR in 80's to 90's.  - Personally Reviewed  ECG    NSR, HR 90 with known LBBB.  - Personally Reviewed  Physical Exam   GEN: Obese female appearing in no acute distress.   Neck: No JVD Cardiac: RRR, no murmurs, rubs, or gallops.  Respiratory: Mild rales along bases.  GI: Soft, nontender, non-distended  MS: Trace lower extremity edema; Right BKA.  Neuro:  Nonfocal  Psych: Normal affect   Labs    High Sensitivity Troponin:   Recent Labs  Lab 10/15/20 1510 10/15/20 1646 10/17/20 1811 10/17/20 2009  TROPONINIHS 18* 18* 13 12      Chemistry Recent Labs  Lab 10/15/20 1510 10/16/20 0649 10/17/20 0509  NA 133* 136 132*  K 6.0* 6.3* 4.5  CL 94* 95* 92*  CO2 '24 25 28  '$ GLUCOSE 287* 205* 159*  BUN 103* 103* 46*  CREATININE 12.76* 13.06* 7.09*  CALCIUM 8.8* 9.0 8.3*  ALBUMIN  --   --  2.6*  GFRNONAA 3* 3* 6*  ANIONGAP 15 16* 12  Hematology Recent Labs  Lab 10/17/20 0509 10/18/20 0538 10/19/20 0554  WBC 7.6 9.3 8.4  RBC 3.06* 3.26* 3.13*  HGB 9.4* 9.8* 9.4*  HCT 27.3* 29.4* 27.7*  MCV 89.2 90.2 88.5  MCH 30.7 30.1 30.0  MCHC 34.4 33.3 33.9  RDW 14.9 14.9 14.9  PLT 229 232 238    BNPNo results for input(s): BNP, PROBNP in the last 168 hours.   DDimer No results for input(s): DDIMER in the last 168 hours.   Radiology     Cardiac Studies   NST: 12/2016 1. Dyspnea occurred during [place] study.     2. Nondiagnostic EKG changes.   3. SPECT evidence of possible mild apical septal ischemia, small defect.  This represents an abnormal perfusion finding.   4. Gated images reveal LVEF = 28%. This represents an abnormal functional  finding.   5. This is a moderate risk study.   6. No prior study available for comparison.   Cardiac  Catheterization: 12/2016 FINDINGS:  1.  Left main large and patent with no significant obstructive disease.    This  bifurcates into an LAD and left circumflex.  2.  Left anterior descending is a medium size vessel, wraps around the  apex with  no significant obstructive disease.  3.  Left circumflex medium size vessel, no significant obstructive  disease.  4.  Right coronary artery relatively small in caliber, dominant calcified  with  no significant obstructive disease.  5.  EDP approximately 30.  6.  Manual pressure 6-French sheath, right groin new.   Echocardiogram: 02/2018 Summary    There is moderate concentric left ventricular hypertrophy.   Overall left ventricular systolic function is mild to moderately impaired   with an EF of 40-45%.   There is mild mitral valve regurgitation.   There is no significant pericardial effusion.   Echocardiogram: 10/18/2020 IMPRESSIONS     1. Left ventricular ejection fraction, by estimation, is 30 to 35%. The  left ventricle has moderately decreased function. The left ventricle  demonstrates global hypokinesis. The left ventricular internal cavity size  was moderately dilated. Left  ventricular diastolic parameters were normal.   2. Right ventricular systolic function is normal. The right ventricular  size is normal. There is moderately elevated pulmonary artery systolic  pressure.   3. The mitral valve is abnormal. Mild mitral valve regurgitation. No  evidence of mitral stenosis. Moderate mitral annular calcification.   4. Calcified non coronary cusp. The aortic valve is tricuspid. There is  moderate calcification of the aortic valve. Aortic valve regurgitation is  not visualized. Mild to moderate aortic valve sclerosis/calcification is  present, without any evidence of  aortic stenosis.   5. The inferior vena cava is dilated in size with >50% respiratory  variability, suggesting right atrial pressure of 8 mmHg.   Patient  Profile     55 y.o. female w/ PMH of HFrEF (EF 40-45% by echo in 02/2018 and presumed to be nonischemic due to cath in 12/2016 showing no significant CAD), PAD (s/p R BKA), HTN, HLD, Type 2 DM, history of DVT (on Coumadin for anticoagulation), ESRD and legal blindness who Cardiology is seeing for evaluation of chest pain.   Assessment & Plan    1. Chest Pain with Atypical Features - Pain occurred during her admission while at rest and spontaneously resolved. She reports similar episodes over the past several years. - EKG showed known LBBB and repeat Hs Troponin values were negative at 13 and 12. - By  review of Care Everywhere, her last ischemic evaluation was in 2018 as NST at that time showed evidence of possible mild apical ischemia and she underwent a cardiac catheterization in 12/2016 which showed no significant obstructive disease.  - Dr. Johnsie Cancel previously recommended a Lexiscan for ischemic evaluation given her EF was further reduced at 30-35% by repeat imaging this admission. Awaiting IV placement and will plan for stress testing once in place. Unless high-risk, would not anticipate a repeat cath this admission given her atypical symptoms and no significant CAD by cath in 2018. If high-risk, she would require Heparin bridging for her procedure given she is on Coumadin.   2. HFrEF - Her EF was at 40-45% by echo in 02/2018, further reduced at 30-35% by repeat imaging this admission. Volume status is being managed by HD.  - Medical therapy for her cardiomyopathy is limited in the setting of hypotension (BP has been at 95/62 - 115/66 within the past 24 hours but had significant hypotension on 10/17/2020 during HD which limited her from meeting her goal). Remains on Midodrine '10mg'$  daily for HD.  - Ideally, would like to start a low-dose BB but might have to only use this on non-HD days. Doubt she would be able to tolerate Hydralazine/Nitrates. Not on an ACE-I/ARB/ARNi/Spiro/SGLT2 inhibitor given ESRD.    3. ESRD - Nephrology following and planning for HD today.   4. Hyperkalemia - K+ peaked at 6.3 this admission, improved to 4.5 on most recent check.   5. Anemia of Chronic Disease - No reports of active bleeding. Hgb remains stable at 9.4 which appears to be close to her baseline.   6. History of DVT - She has been on Coumadin for anticoagulation. INR at 2.8 this AM.   For questions or updates, please contact [place] Please consult [email or web address] for contact info under        Signed, Erma Heritage, PA-C  10/19/2020, 7:42 AM    -------------------------------------------------------------------------------------------------- Attending Note:  Patient seen and examined. Agree with note by Erma Heritage, PA-C. Ms Foist is a 55 yo female with a hx of HFrEF (non-ischemic CM), chronic LBBB on ECG, anemia and ESRD, who is admitted with chest pain. Hs-Trops are negative. Chest pain has atypical features. Cath in 2018 had shown no significant coronary artery disease.  Myoview scan from today shows multiple fixed defects with little reversibility. Pattern seems to fit a cardiomyopathy with scarring. EF is reduced (also seen on the echo).  Ideally would optimize heart failure therapy. Understandably there are limitations due to hypotension. Fluid management is primarily through dialysis.  Melina Schools, MD, [facility]

## 2020-10-19 NOTE — Progress Notes (Signed)
ANTICOAGULATION CONSULT NOTE -    Pharmacy Consult for warfarin dosing  Indication: VTE prophylaxis    Allergies  Allergen Reactions   Iodinated Diagnostic Agents Hives, Shortness Of Breath and Nausea And Vomiting    IVP   Shellfish Allergy Hives and Nausea And Vomiting    "LOBSTER ONLY" Lobster    Dulaglutide Nausea And Vomiting   Gabapentin Other (See Comments)    Other reaction(s): Delirium    Lorazepam Other (See Comments)    Other reaction(s): Hallucination       Patient Measurements: Last Weight  Most recent update: 10/17/2020  3:11 PM    Weight  119.5 kg (263 lb 7.2 oz)            Body mass index is 45.22 kg/m. Stacey Harrell               Temp: 98.3 F (36.8 C) (07/07 0452) BP: 115/66 (07/07 0452) Pulse Rate: 87 (07/07 0452)  Labs: Recent Labs    10/17/20 0509 10/18/20 0538 10/19/20 0554  HGB 9.4* 9.8* 9.4*  HCT 27.3* 29.4* 27.7*  PLT 229 232 238  LABPROT 23.7* 26.0* 29.4*  INR 2.1* 2.4* 2.8*  CREATININE 7.09*  --   --      Estimated Creatinine Clearance: 11.3 mL/min (A) (by C-G formula based on SCr of 7.09 mg/dL (H)).     Medications:  Medications Prior to Admission  Medication Sig Dispense Refill Last Dose   albuterol (PROVENTIL) (5 MG/ML) 0.5% nebulizer solution Take 2.5 mg by nebulization every 6 (six) hours as needed for wheezing or shortness of breath.   PRN   albuterol (VENTOLIN HFA) 108 (90 Base) MCG/ACT inhaler Inhale 2 puffs into the lungs every 4 (four) hours as needed for wheezing or shortness of breath.   10/15/2020   Cholecalciferol 25 MCG (1000 UT) tablet Take 1,000 Units by mouth daily. Takes on Tues, Thurs,Sat   10/10/2020   insulin aspart (NOVOLOG) 100 UNIT/ML injection Inject 8 Units into the skin 3 (three) times daily before meals.   10/14/2020   lidocaine-prilocaine (EMLA) cream Apply 1 application topically daily. Takes on Tues, Thurs, Sat   10/10/2020   pantoprazole (PROTONIX) 40 MG tablet Take 40 mg by mouth daily.   10/14/2020    rosuvastatin (CRESTOR) 20 MG tablet Take 20 mg by mouth every 7 (seven) days.   Past Week   sevelamer carbonate (RENVELA) 800 MG tablet Take 4 tablets by mouth 3 (three) times daily.   10/14/2020   warfarin (COUMADIN) 7.5 MG tablet Take 3.5-7.5 mg by mouth daily. 3.5 mg daily T,R,S,S 7.5 mg daily M,W,F   10/14/2020 at 1000   [DISCONTINUED] insulin detemir (LEVEMIR) 100 UNIT/ML injection Inject 14 Units into the skin at bedtime.   10/14/2020   Scheduled:   Chlorhexidine Gluconate Cloth  6 each Topical Q0600   Chlorhexidine Gluconate Cloth  6 each Topical Q0600   Chlorhexidine Gluconate Cloth  6 each Topical Q0600   darbepoetin (ARANESP) injection - DIALYSIS  60 mcg Intravenous Q Tue-HD   insulin aspart  0-15 Units Subcutaneous TID WC   insulin aspart  0-5 Units Subcutaneous QHS   insulin detemir  8 Units Subcutaneous QHS   lidocaine  1 patch Transdermal Q24H   midodrine  10 mg Oral Q T,Th,Sa-HD   pantoprazole  40 mg Oral Daily   regadenoson  0.4 mg Intravenous Once   rosuvastatin  20 mg Oral Q7 days   sevelamer carbonate  3,200 mg Oral TID with  meals   warfarin  3 mg Oral ONCE-1600   Warfarin - Pharmacist Dosing Inpatient   Does not apply q1600   Infusions:   sodium chloride     sodium chloride     ferric gluconate (FERRLECIT) IVPB 125 mg (10/17/20 1530)    PRN: sodium chloride, sodium chloride, albuterol, alteplase, heparin, lidocaine (PF), lidocaine-prilocaine, ondansetron **OR** ondansetron (ZOFRAN) IV, pentafluoroprop-tetrafluoroeth, technetium tetrofosmin Anti-infectives (From admission, onward)    None       Goal of Therapy:  INR 2-3 Monitor platelets by anticoagulation protocol: Yes    Prior to Admission Warfarin Dosing:  Stacey Harrell takes 7.'5mg'$  of warfarin MWF and 3.'5mg'$  qTuThSaSun     Admit INR was 1.6 Lab Results  Component Value Date   INR 2.8 (H) 10/19/2020   INR 2.4 (H) 10/18/2020   INR 2.1 (H) 10/17/2020    Assessment: Stacey Harrell a 55 y.o. female  requires anticoagulation with warfarin for the indication of  VTE prophylaxis. Warfarin will be initiated inpatient following pharmacy protocol per pharmacy consult. Patient most recent blood work is as follows:   CBC Latest Ref Rng & Units 10/19/2020 10/18/2020 10/17/2020  WBC 4.0 - 10.5 K/uL 8.4 9.3 7.6  Hemoglobin 12.0 - 15.0 g/dL 9.4(L) 9.8(L) 9.4(L)  Hematocrit 36.0 - 46.0 % 27.7(L) 29.4(L) 27.3(L)  Platelets 150 - 400 K/uL 238 232 229     Plan: Warfarin 3 mg po x 1  Monitor CBC daily with am labs   Monitor INR daily Monitor for signs and symptoms of bleeding   Margot Ables, PharmD Clinical Pharmacist 10/19/2020 10:19 AM

## 2020-11-02 ENCOUNTER — Ambulatory Visit: Payer: Medicare HMO | Admitting: Cardiovascular Disease

## 2020-11-08 NOTE — Progress Notes (Signed)
CARDIOLOGY CONSULT NOTE       Patient ID: Stacey Harrell MRN: [id number] DOB/AGE: [birth year] 55 y.o.  Admit date: (Not on file) Referring Physician: Simone Curia  Primary Physician: Pcp, No Primary Cardiologist: New Reason for Consultation: CHF  Active Problems:   * No active hospital problems. *   HPI:  55 y.o. recently moved from [place] to [place] She has IDDM, ESRD on dialysis Admitted to hospital 10/15/20 for volume overload after not having dialysis for days due to move She had some atypical ? 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 Echo done 10/18/20 showed EF 30-35% global hypokinesis with normal RV and mild MR AV sclerosis no stenosis Myovue done 10/19/20 low risk with soft tissue attenuation and EF32%  Notes as far back as 2016 had EF 30% Cath done 12/31/16 with no significant CAD She had a myovue prior to cath which suggested mild apical septal ischemia with EF 28%   Echo done 12/23/16 with EF 30-35% mild to moderate MR and moderate TR   Chronic dyspnea having issues getting her INR checked   ROS All other systems reviewed and negative except as noted above  Past Medical History:  Diagnosis Date   Asthma    Blindness, bilateral    Diabetes mellitus without complication ([place])    Renal disorder     History reviewed. No pertinent family history.  Social History   Socioeconomic History   Marital status: Single    Spouse name: Not on file   Number of children: Not on file   Years of education: Not on file   Highest education level: Not on file  Occupational History   Not on file  Tobacco Use   Smoking status: Former    Years: 40.00    Types: Cigarettes    Smokeless tobacco: Never  Vaping Use   Vaping Use: Never used  Substance and Sexual Activity   Alcohol use: Not Currently   Drug use: Not Currently   Sexual activity: Not on file  Other Topics Concern   Not on file  Social History Narrative   Not on file   Social Determinants of Health   Financial Resource Strain: Not on file  Food Insecurity: Not on file  Transportation Needs: Not on file  Physical Activity: Not on file  Stress: Not on file  Social Connections: Not on file  Intimate Partner Violence: Not on file    Past Surgical History:  Procedure Laterality Date   AV FISTULA PLACEMENT Right    left foot two toe amputation     rt bka        Current Outpatient Medications:    albuterol (PROVENTIL) (5 MG/ML) 0.5% nebulizer solution, Take 2.5 mg by nebulization every 6 (six) hours as needed for wheezing or shortness of breath., Disp: , Rfl:    albuterol (VENTOLIN HFA) 108 (90 Base) MCG/ACT inhaler, Inhale 2 puffs into the lungs every 4 (four) hours as needed for wheezing or shortness of breath., Disp: , Rfl:    Cholecalciferol 25 MCG (1000 UT) tablet, Take 1,000 Units by mouth daily. Takes on Tues, Thurs,Sat, Disp: , Rfl:    insulin aspart (NOVOLOG) 100 UNIT/ML injection,  Inject 8 Units into the skin 3 (three) times daily before meals., Disp: , Rfl:    insulin detemir (LEVEMIR) 100 UNIT/ML injection, Inject 0.08 mLs (8 Units total) into the skin at bedtime., Disp: 10 mL, Rfl: 11   lidocaine-prilocaine (EMLA) cream, Apply 1 application topically daily. Takes on Tues, Thurs, Sat, Disp: , Rfl:    midodrine (PROAMATINE) 10 MG tablet, Take 1 tablet (10 mg total) by mouth every Monday, Wednesday, and Friday with hemodialysis., Disp: 60 tablet, Rfl: 1   pantoprazole (PROTONIX) 40 MG tablet, Take 40 mg by mouth daily., Disp: , Rfl:    rosuvastatin (CRESTOR) 20 MG tablet, Take 20 mg by mouth every 7 (seven) days., Disp: , Rfl:    sevelamer carbonate (RENVELA) 800 MG tablet, Take 4  tablets by mouth 3 (three) times daily., Disp: , Rfl:    warfarin (COUMADIN) 7.5 MG tablet, Take 3.5-7.5 mg by mouth daily. 3.5 mg daily T,R,S,S 7.5 mg daily M,W,F, Disp: , Rfl:     Physical Exam: Height '5\' 4"'$  (1.626 m), weight 118.4 kg.    Affect appropriate Obese black female HEENT: blind  Neck supple with no adenopathy JVP normal no bruits no thyromegaly Lungs clear with no wheezing and good diaphragmatic motion Heart:  S1/S2 no murmur, no rub, gallop or click PMI normal Abdomen: benighn, BS positve, no tenderness, no AAA no bruit.  No HSM or HJR RLE amputation  No edema Neuro non-focal Skin warm and dry No muscular weakness Fistula for dialysis both UE;s right functional    Labs:   Lab Results  Component Value Date   WBC 8.4 10/19/2020   HGB 9.4 (L) 10/19/2020   HCT 27.7 (L) 10/19/2020   MCV 88.5 10/19/2020   PLT 238 10/19/2020   No results for input(s): NA, K, CL, CO2, BUN, CREATININE, CALCIUM, PROT, BILITOT, ALKPHOS, ALT, AST, GLUCOSE in the last 168 hours.  Invalid input(s): LABALBU No results found for: CKTOTAL, CKMB, CKMBINDEX, TROPONINI No results found for: CHOL No results found for: HDL No results found for: LDLCALC No results found for: TRIG No results found for: CHOLHDL No results found for: LDLDIRECT    Radiology: DG Chest 2 View  Result Date: 10/15/2020 CLINICAL DATA:  Chest pain and shortness of breath. EXAM: CHEST - 2 VIEW COMPARISON:  None. FINDINGS: The heart is enlarged. There is a vascular stent to the right of the mediastinum. Surgical clips adjacent to the right lung apex. Peribronchial thickening may be bronchitic or congestive minimal fluid in the fissures without significant sub pulmonic effusion. No confluent consolidation. No pneumothorax. No acute osseous abnormalities are seen. IMPRESSION: 1. Cardiomegaly. 2. Peribronchial thickening may be bronchitic or congestive. Fluid in the fissures. Findings suggest fluid overload. Electronically  Signed   By: Keith Rake M.D.   On: 10/15/2020 15:37   NM Myocar Multi W/Spect W/Wall Motion / EF  Result Date: 10/19/2020  Lexiscan stress is electrically nondiagnostic  Myoview scan shows mModerate decreased tracer activity involving anterior (mid,distal), anteroseptal (base, mid, distal), inferoseptal(distal), inferior (mid, distal) that improves only in the distal inferoseptal region. Does not appear to reflect significant ischemia. Overall reflects scar and probable soft tissue attenuation.  LVEF on gating calculated at 32% with hypokinesis worse in the anterior and septal walls.  High risk study based on degree of LV dysfunction    ECHOCARDIOGRAM COMPLETE  Result Date: 10/18/2020    ECHOCARDIOGRAM REPORT   Patient Name:   CAIDEN DOLLARHIDE Date of Exam: 10/18/2020 Medical Rec #:  [id number]      Height:       64.0 in Accession #:    [id number]     Weight:       263.4 lb Date of Birth:  Nov 24, 1965      BSA:          2.199 m Patient Age:    38 years       BP:           113/61 mmHg Patient Gender: F              HR:           93 bpm. Exam Location:  [facility] Procedure: 2D Echo, Cardiac Doppler and Color Doppler Indications:    Chest Pain  History:        Patient has no prior history of Echocardiogram examinations.                 CHF, Stroke, Arrythmias:LBBB; Risk Factors:Diabetes.  Sonographer:    Wenda Low Referring Phys: [phone] DAVID TAT IMPRESSIONS  1. Left ventricular ejection fraction, by estimation, is 30 to 35%. The left ventricle has moderately decreased function. The left ventricle demonstrates global hypokinesis. The left ventricular internal cavity size was moderately dilated. Left ventricular diastolic parameters were normal.  2. Right ventricular systolic function is normal. The right ventricular size is normal. There is moderately elevated pulmonary artery systolic pressure.  3. The mitral valve is abnormal. Mild mitral valve regurgitation. No evidence of mitral stenosis. Moderate  mitral annular calcification.  4. Calcified non coronary cusp. The aortic valve is tricuspid. There is moderate calcification of the aortic valve. Aortic valve regurgitation is not visualized. Mild to moderate aortic valve sclerosis/calcification is present, without any evidence of aortic stenosis.  5. The inferior vena cava is dilated in size with >50% respiratory variability, suggesting right atrial pressure of 8 mmHg. FINDINGS  Left Ventricle: Left ventricular ejection fraction, by estimation, is 30 to 35%. The left ventricle has moderately decreased function. The left ventricle demonstrates global hypokinesis. The left ventricular internal cavity size was moderately dilated. There is no left ventricular hypertrophy. Left ventricular diastolic parameters were normal. Right Ventricle: The right ventricular size is normal. No increase in right ventricular wall thickness. Right ventricular systolic function is normal. There is moderately elevated pulmonary artery systolic pressure. The tricuspid regurgitant velocity is 3.17 m/s, and with an assumed right atrial pressure of 8 mmHg, the estimated right ventricular systolic pressure is [id number] mmHg. Left Atrium: Left atrial size was normal in size. Right Atrium: Right atrial size was normal in size. Pericardium: There is no evidence of pericardial effusion. Mitral Valve: The mitral valve is abnormal. There is mild thickening of the mitral valve leaflet(s). There is mild calcification of the mitral valve leaflet(s). Moderate mitral annular calcification. Mild mitral valve regurgitation. No evidence of mitral  valve stenosis. MV peak gradient, 8.1 mmHg. The mean mitral valve gradient is 2.0 mmHg. Tricuspid Valve: The tricuspid valve is normal in structure. Tricuspid valve regurgitation is not demonstrated. No evidence of tricuspid stenosis. Aortic Valve: Calcified non coronary cusp. The aortic valve is tricuspid. There is moderate calcification of the aortic valve. Aortic  valve regurgitation is not visualized. Mild to moderate aortic valve sclerosis/calcification is present, without any evidence of aortic stenosis. Aortic valve mean gradient measures 4.0 mmHg. Aortic valve peak gradient measures 7.7 mmHg. Aortic valve area, by VTI measures 1.94 cm. Pulmonic Valve: The pulmonic valve was normal in structure. Pulmonic valve regurgitation  is not visualized. No evidence of pulmonic stenosis. Aorta: The aortic root is normal in size and structure. Venous: The inferior vena cava is dilated in size with greater than 50% respiratory variability, suggesting right atrial pressure of 8 mmHg. IAS/Shunts: No atrial level shunt detected by color flow Doppler.  LEFT VENTRICLE PLAX 2D LVIDd:         5.46 cm     Diastology LVIDs:         4.12 cm     LV e' medial:    7.13 cm/s LV PW:         0.91 cm     LV E/e' medial:  17.4 LV IVS:        0.87 cm     LV e' lateral:   10.40 cm/s LVOT diam:     1.90 cm     LV E/e' lateral: 11.9 LV SV:         51 LV SV Index:   23 LVOT Area:     2.84 cm  LV Volumes (MOD) LV vol d, MOD A2C: 98.3 ml LV vol d, MOD A4C: 85.8 ml LV vol s, MOD A2C: 72.7 ml LV vol s, MOD A4C: 61.9 ml LV SV MOD A2C:     25.6 ml LV SV MOD A4C:     85.8 ml LV SV MOD BP:      22.9 ml RIGHT VENTRICLE RV Basal diam:  3.30 cm RV S prime:     9.45 cm/s TAPSE (M-mode): 1.9 cm LEFT ATRIUM             Index       RIGHT ATRIUM           Index LA diam:        3.90 cm 1.77 cm/m  RA Area:     14.50 cm LA Vol (A2C):   51.6 ml 23.46 ml/m RA Volume:   35.20 ml  16.00 ml/m LA Vol (A4C):   76.3 ml 34.69 ml/m LA Biplane Vol: 66.0 ml 30.01 ml/m  AORTIC VALVE AV Area (Vmax):    1.93 cm AV Area (Vmean):   1.69 cm AV Area (VTI):     1.94 cm AV Vmax:           139.00 cm/s AV Vmean:          92.700 cm/s AV VTI:            0.264 m AV Peak Grad:      7.7 mmHg AV Mean Grad:      4.0 mmHg LVOT Vmax:         94.60 cm/s LVOT Vmean:        55.400 cm/s LVOT VTI:          0.181 m LVOT/AV VTI ratio: 0.69 MITRAL VALVE                 TRICUSPID VALVE MV Area (PHT): 7.44 cm     TR Peak grad:   40.2 mmHg MV Area VTI:   2.48 cm     TR Vmax:        317.00 cm/s MV Peak grad:  8.1 mmHg MV Mean grad:  2.0 mmHg     SHUNTS MV Vmax:       1.42 m/s     Systemic VTI:  0.18 m MV Vmean:      60.8 cm/s    Systemic Diam: 1.90 cm MV Decel Time: 102 msec MV E velocity: 124.00  cm/s MV A velocity: 73.80 cm/s MV E/A ratio:  1.68 Jenkins Rouge MD Electronically signed by Jenkins Rouge MD Signature Date/Time: 10/18/2020/11:53:15 AM    Final     EKG: 10/17/20 SR LBBB    ASSESSMENT AND PLAN:   Non ischemic dilated cardiomyopathy:  clean cath in 2018  low risk non ischemic myovue 10/19/20  EF 30-35% Volume controlled with dialysis Medical Rx limited by low BP with dialysis patient is on midodrine   DVT:  history of on coumadin INR to be done today but she needs local f/u If not offered her to be checked by Edrick Oh in [place] office  CRF:  Dialysis with [facility] in [place] Will likely need vascular f/u given LE issues and problems with access / fistula in past  DM:  on insulin f/u primary A1c 7.6 10/15/20   F/U in a year   Signed: Jenkins Rouge 11/14/2020, 2:45 PM

## 2020-11-14 ENCOUNTER — Ambulatory Visit (INDEPENDENT_AMBULATORY_CARE_PROVIDER_SITE_OTHER): Payer: Medicare HMO | Admitting: Cardiovascular Disease

## 2020-11-14 ENCOUNTER — Encounter: Payer: Self-pay | Admitting: Cardiovascular Disease

## 2020-11-14 ENCOUNTER — Other Ambulatory Visit (HOSPITAL_COMMUNITY)
Admission: RE | Admit: 2020-11-14 | Discharge: 2020-11-14 | Disposition: A | Discharge status: Home / Self Care | Payer: Medicare HMO | Source: Ambulatory Visit | Attending: Cardiovascular Disease | Admitting: Cardiovascular Disease

## 2020-11-14 ENCOUNTER — Other Ambulatory Visit: Payer: Self-pay

## 2020-11-14 VITALS — BP 110/70 | HR 97 | Ht 64.0 in | Wt 261.0 lb

## 2020-11-14 DIAGNOSIS — I420 Dilated cardiomyopathy: Secondary | ICD-10-CM | POA: Diagnosis not present

## 2020-11-14 DIAGNOSIS — Z7901 Long term (current) use of anticoagulants: Secondary | ICD-10-CM | POA: Diagnosis present

## 2020-11-14 DIAGNOSIS — E1139 Type 2 diabetes mellitus with other diabetic ophthalmic complication: Secondary | ICD-10-CM | POA: Diagnosis not present

## 2020-11-14 DIAGNOSIS — Z5181 Encounter for therapeutic drug level monitoring: Secondary | ICD-10-CM | POA: Insufficient documentation

## 2020-11-14 DIAGNOSIS — I132 Hypertensive heart and chronic kidney disease with heart failure and with stage 5 chronic kidney disease, or end stage renal disease: Secondary | ICD-10-CM

## 2020-11-14 LAB — PROTIME-INR
INR: 2.9 — ABNORMAL HIGH (ref 0.8–1.2)
Prothrombin Time: 29.9 seconds — ABNORMAL HIGH (ref 11.4–15.2)

## 2020-11-14 NOTE — Patient Instructions (Signed)
Medication Instructions:  Your physician recommends that you continue on your current medications as directed. Please refer to the Current Medication list given to you today.  *If you need a refill on your cardiac medications before your next appointment, please call your pharmacy*   Lab Work: Your physician recommends that you return for lab work in: Today   If you have labs (blood work) drawn today and your tests are completely normal, you will receive your results only by: MyChart Message (if you have MyChart) OR A paper copy in the mail If you have any lab test that is abnormal or we need to change your treatment, we will call you to review the results.   Testing/Procedures: NONE    Follow-Up: At [facility], you and your health needs are our priority.  As part of our continuing mission to provide you with exceptional heart care, we have created designated Provider Care Teams.  These Care Teams include your primary Cardiologist (physician) and Advanced Practice Providers (APPs -  Physician Assistants and Nurse Practitioners) who all work together to provide you with the care you need, when you need it.  We recommend signing up for the patient portal called "MyChart".  Sign up information is provided on this After Visit Summary.  MyChart is used to connect with patients for Virtual Visits (Telemedicine).  Patients are able to view lab/test results, encounter notes, upcoming appointments, etc.  Non-urgent messages can be sent to your provider as well.   To learn more about what you can do with MyChart, go to [email or web address].    Your next appointment:   6 month(s)  The format for your next appointment:   In Person  Provider:   Jenkins Rouge, MD   Other Instructions Thank you for choosing [facility]!

## 2021-07-04 ENCOUNTER — Encounter (HOSPITAL_COMMUNITY): Payer: Self-pay | Admitting: Emergency Medicine

## 2021-07-04 ENCOUNTER — Emergency Department (HOSPITAL_COMMUNITY): Payer: Medicaid Other

## 2021-07-04 ENCOUNTER — Other Ambulatory Visit: Payer: Self-pay

## 2021-07-04 ENCOUNTER — Emergency Department (HOSPITAL_COMMUNITY)
Admission: EM | Admit: 2021-07-04 | Discharge: 2021-07-04 | Disposition: A | Discharge status: Home / Self Care | Payer: Medicaid Other | Attending: Emergency Medicine | Admitting: Emergency Medicine

## 2021-07-04 DIAGNOSIS — N186 End stage renal disease: Secondary | ICD-10-CM | POA: Insufficient documentation

## 2021-07-04 DIAGNOSIS — Z7901 Long term (current) use of anticoagulants: Secondary | ICD-10-CM | POA: Insufficient documentation

## 2021-07-04 DIAGNOSIS — Z794 Long term (current) use of insulin: Secondary | ICD-10-CM | POA: Diagnosis not present

## 2021-07-04 DIAGNOSIS — R079 Chest pain, unspecified: Secondary | ICD-10-CM | POA: Diagnosis present

## 2021-07-04 DIAGNOSIS — E1122 Type 2 diabetes mellitus with diabetic chronic kidney disease: Secondary | ICD-10-CM | POA: Diagnosis not present

## 2021-07-04 DIAGNOSIS — Z992 Dependence on renal dialysis: Secondary | ICD-10-CM | POA: Insufficient documentation

## 2021-07-04 DIAGNOSIS — J45909 Unspecified asthma, uncomplicated: Secondary | ICD-10-CM | POA: Diagnosis not present

## 2021-07-04 LAB — TROPONIN I (HIGH SENSITIVITY)
Troponin I (High Sensitivity): 8 ng/L (ref <18)
Troponin I (High Sensitivity): 8 ng/L (ref <18)

## 2021-07-04 LAB — BASIC METABOLIC PANEL
Anion gap: 15 (ref 5–15)
BUN: 82 mg/dL — ABNORMAL HIGH (ref 6–20)
CO2: 23 mmol/L (ref 22–32)
Calcium: 7.8 mg/dL — ABNORMAL LOW (ref 8.9–10.3)
Chloride: 99 mmol/L (ref 98–111)
Creatinine, Ser: 10.4 mg/dL — ABNORMAL HIGH (ref 0.44–1.00)
GFR, Estimated: 4 mL/min — ABNORMAL LOW (ref >60)
Glucose, Bld: 173 mg/dL — ABNORMAL HIGH (ref 70–99)
Potassium: 5.5 mmol/L — ABNORMAL HIGH (ref 3.5–5.1)
Sodium: 137 mmol/L (ref 135–145)

## 2021-07-04 LAB — CBC
HCT: 33.7 % — ABNORMAL LOW (ref 36.0–46.0)
Hemoglobin: 11.3 g/dL — ABNORMAL LOW (ref 12.0–15.0)
MCH: 28.4 pg (ref 26.0–34.0)
MCHC: 33.5 g/dL (ref 30.0–36.0)
MCV: 84.7 fL (ref 80.0–100.0)
Platelets: 268 10*3/uL (ref 150–400)
RBC: 3.98 MIL/uL (ref 3.87–5.11)
RDW: 14.2 % (ref 11.5–15.5)
WBC: 11.9 10*3/uL — ABNORMAL HIGH (ref 4.0–10.5)
nRBC: 0 % (ref 0.0–0.2)

## 2021-07-04 LAB — HCG, QUANTITATIVE, PREGNANCY: hCG, Beta Chain, Quant, S: 9 m[IU]/mL — ABNORMAL HIGH (ref <5)

## 2021-07-04 LAB — PROTIME-INR
INR: 1.5 — ABNORMAL HIGH (ref 0.8–1.2)
Prothrombin Time: 17.9 seconds — ABNORMAL HIGH (ref 11.4–15.2)

## 2021-07-04 MED ORDER — SODIUM ZIRCONIUM CYCLOSILICATE 5 G PO PACK: 5.0000 g | PACK | Freq: Once | ORAL | Status: DC

## 2021-07-04 NOTE — Discharge Instructions (Signed)
Get dialysis tomorrow. ?

## 2021-07-04 NOTE — ED Provider Notes (Signed)
?[facility] ?Provider Note ? ? ?CSN: [id number] ?Arrival date & time: 07/04/21  1603 ? ?  ? ?History ? ?Chief Complaint  ?Patient presents with  ? Chest Pain  ? ? ?Stacey Harrell is a 56 y.o. female. ? ? ?Chest Pain ?Associated symptoms: no abdominal pain, no back pain, no shortness of breath and no weakness   ?Patient is a dialysis patient.  Was at dialysis and states they were having difficulty accessing her arm.  States that they were dialyzing her and developed anterior chest pain and some shortness of breath.  Some nausea.  Patient states she informed staff of the chest pain and was told EMS would have to be called.  Patient's chest pain is feeling better.  States she still feels a little nauseous and still has pain where the arm was accessed.  Has been doing well the last few days.  States she was only up 2 and half kilograms.  States that is not a whole lot for her.  No fevers or chills. ?  ?Past Medical History:  ?Diagnosis Date  ? Asthma   ? Blindness, bilateral   ? Diabetes mellitus without complication ([place])   ? Renal disorder   ? ? ?Home Medications ?Prior to Admission medications   ?Medication Sig Start Date End Date Taking? Authorizing Provider  ?albuterol (PROVENTIL) (5 MG/ML) 0.5% nebulizer solution Take 2.5 mg by nebulization every 6 (six) hours as needed for wheezing or shortness of breath.    [provider]  ?albuterol (VENTOLIN HFA) 108 (90 Base) MCG/ACT inhaler Inhale 2 puffs into the lungs every 4 (four) hours as needed for wheezing or shortness of breath.    [provider]  ?Cholecalciferol 25 MCG (1000 UT) tablet Take 1,000 Units by mouth daily. Takes on Tues, Thurs,Sat    [provider]  ?insulin aspart (NOVOLOG) 100 UNIT/ML injection Inject 8 Units into the skin 3 (three) times daily before meals.    [provider]  ?insulin detemir (LEVEMIR) 100 UNIT/ML injection Inject 0.08 mLs (8 Units total) into the skin at bedtime. 10/18/20    Johnson, Clanford L, MD  ?lidocaine-prilocaine (EMLA) cream Apply 1 application topically daily. Takes on Tues, Thurs, Sat    [provider]  ?midodrine (PROAMATINE) 10 MG tablet Take 1 tablet (10 mg total) by mouth every Monday, Wednesday, and Friday with hemodialysis. 10/18/20   Orson Eva, MD  ?pantoprazole (PROTONIX) 40 MG tablet Take 40 mg by mouth daily. 08/24/20   [provider]  ?rosuvastatin (CRESTOR) 20 MG tablet Take 20 mg by mouth every 7 (seven) days. 07/08/19   [provider]  ?sevelamer carbonate (RENVELA) 800 MG tablet Take 4 tablets by mouth 3 (three) times daily. 09/25/20   [provider]  ?warfarin (COUMADIN) 7.5 MG tablet Take 3.5-7.5 mg by mouth daily. 3.5 mg daily T,R,S,S ?7.5 mg daily M,W,F    [provider]  ?   ? ?Allergies    ?Iodinated contrast media, Shellfish allergy, Dulaglutide, Gabapentin, and Lorazepam   ? ?Review of Systems   ?Review of Systems  ?Constitutional:  Negative for appetite change.  ?Respiratory:  Negative for shortness of breath.   ?Cardiovascular:  Positive for chest pain.  ?Gastrointestinal:  Negative for abdominal pain.  ?Musculoskeletal:  Negative for back pain.  ?Neurological:  Negative for weakness.  ? ?Physical Exam ?Updated Vital Signs ?BP (!) 110/40   Pulse 82   Temp 97.6 ?F (36.4 ?C) (Oral)   Resp 20  Ht 5\' 4"  (1.626 m)   Wt 118.4 kg   SpO2 98%   BMI 44.80 kg/m?  ?Physical Exam ?Vitals and nursing note reviewed.  ?HENT:  ?   Head: Normocephalic.  ?Cardiovascular:  ?   Rate and Rhythm: Regular rhythm.  ?Pulmonary:  ?   Breath sounds: Normal breath sounds.  ?Chest:  ?   Chest wall: No tenderness.  ?Abdominal:  ?   Tenderness: There is no abdominal tenderness.  ?Musculoskeletal:  ?   Comments: Dressing over dialysis site on right arm.  ?Neurological:  ?   Mental Status: She is alert.  ? ? ?ED Results / Procedures / Treatments   ?Labs ?(all labs ordered are listed, but only abnormal results are displayed) ?Labs  Reviewed  ?CBC - Abnormal; Notable for the following components:  ?    Result Value  ? WBC 11.9 (*)   ? Hemoglobin 11.3 (*)   ? HCT 33.7 (*)   ? All other components within normal limits  ?PROTIME-INR - Abnormal; Notable for the following components:  ? Prothrombin Time 17.9 (*)   ? INR 1.5 (*)   ? All other components within normal limits  ?BASIC METABOLIC PANEL - Abnormal; Notable for the following components:  ? Potassium 5.5 (*)   ? Glucose, Bld 173 (*)   ? BUN 82 (*)   ? Creatinine, Ser 10.40 (*)   ? Calcium 7.8 (*)   ? GFR, Estimated 4 (*)   ? All other components within normal limits  ?HCG, QUANTITATIVE, PREGNANCY - Abnormal; Notable for the following components:  ? hCG, Beta Chain, Quant, S 9 (*)   ? All other components within normal limits  ?TROPONIN I (HIGH SENSITIVITY)  ?TROPONIN I (HIGH SENSITIVITY)  ? ? ?EKG ?EKG Interpretation ? ?Date/Time:  Wednesday July 04 2021 16:16:59 EDT ?Ventricular Rate:  79 ?PR Interval:  146 ?QRS Duration: 138 ?QT Interval:  474 ?QTC Calculation: 543 ?R Axis:   84 ?Text Interpretation: Normal sinus rhythm Non-specific intra-ventricular conduction block Cannot rule out Anterior infarct , age undetermined Abnormal ECG When compared with ECG of 17-Oct-2020 18:33, QRS duration has decreased No significant change since last tracing Confirmed by Davonna Belling [id number]) on 07/04/2021 5:15:51 PM ? ?Radiology ?DG Chest 1 View ? ?Result Date: 07/04/2021 ?CLINICAL DATA:  Central chest pain starting today. Nausea and dizziness. EXAM: CHEST  1 VIEW COMPARISON:  AP chest 10/15/2020 FINDINGS: Cardiac silhouette is again mildly enlarged. Mediastinal contours are within normal limits. A right superior mediastinum vascular stent is again noted. Surgical clips again overlie the right neck base. Minimal bilateral interstitial thickening is similar to prior. No pleural effusion or pneumothorax. No acute skeletal abnormality. IMPRESSION: No significant change in mild cardiomegaly and minimal  bilateral interstitial thickening which may represent minimal interstitial pulmonary edema. Electronically Signed   By: Yvonne Kendall M.D.   On: 07/04/2021 17:00   ? ?Procedures ?Procedures  ? ? ?Medications Ordered in ED ?Medications  ?sodium zirconium cyclosilicate (LOKELMA) packet 5 g (5 g Oral Not Given 07/04/21 2054)  ? ? ?ED Course/ Medical Decision Making/ A&P ?  ?                        ?Medical Decision Making ?Amount and/or Complexity of Data Reviewed ?Labs: ordered. ? ?Risk ?Prescription drug management. ? ? ?Patient presents with chest pain.  Began while at dialysis.  Reportedly only got about 10 minutes of dialysis.  Pain feels better now.  States she feels it was something to do with the way her arm was accessed.  Troponin negative x2.  Chest x-ray reassuring and independently interpreted.  May have some mild edema.  Potassium mildly elevated.  Will give Lokelma.  Doubt cardiac ischemia as the cause of the pain.  Does not appear to need admission in the hospital for emergent dialysis.  Hopefully can be dialyzed tomorrow.  Will discharge home.  Doubt pulmonary embolism ? ? ? ? ? ? ? ?Final Clinical Impression(s) / ED Diagnoses ?Final diagnoses:  ?Nonspecific chest pain  ?End stage renal disease on dialysis [facility])  ? ? ?Rx / DC Orders ?ED Discharge Orders   ? ? None  ? ?  ? ? ?  ?Davonna Belling, MD ?07/04/21 2321 ? ?

## 2021-07-04 NOTE — ED Triage Notes (Signed)
Pt reports she began having center chest pain after 10 mins of her dialysis treatment today, further reports nausea and dizziness; bleeding to fistula site ?

## 2021-09-12 NOTE — Progress Notes (Signed)
CARDIOLOGY CONSULT NOTE       Patient ID: Stacey Harrell MRN: [id number] DOB/AGE: [birth year] 56 y.o.  Referring Physician: Simone Curia  Primary Physician: Marjo Bicker, MD Primary Cardiologist: Johnsie Cancel Reason for Consultation: CHF   HPI:  56 y.o. moved from [place] to [place] She has IDDM, ESRD on dialysis Admitted to hospital 57/3/22 for volume overload after not having dialysis for days due to move She had some atypical ? 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 Echo done 10/18/20 showed EF 30-35% global hypokinesis with normal RV and mild MR AV sclerosis no stenosis Myovue done 10/19/20 low risk with soft tissue attenuation and EF32%  Notes as far back as 2016 had EF 30% Cath done 12/31/16 with no significant CAD She had a myovue prior to cath which suggested mild apical septal ischemia with EF 28%   Echo done 12/23/16 with EF 30-35% mild to moderate MR and moderate TR   Chronic dyspnea having issues getting her INR checked SubRx last in Epic 1.5 on 07/04/21   Seen in ED 07/05/21 with chest pain Was at dialysis and difficulty accessing fistula and developed anterior chest pain with dyspnea In ER just nausea and pain in arm ECG with IVCD improved from full LBBB Troponin negative x 2 Hct stable 33.7 CXR NAD   ROS All other systems reviewed and negative except as noted above  Past Medical History:  Diagnosis Date   Asthma    Blindness, bilateral    Diabetes mellitus without complication ([place])    Renal disorder     History reviewed. No pertinent family history.  Social History   Socioeconomic History   Marital status: Single    Spouse name: Not on file   Number of  children: Not on file   Years of education: Not on file   Highest education level: Not on file  Occupational History   Not on file  Tobacco Use   Smoking status: Former    Years: 40.00    Types: Cigarettes   Smokeless tobacco: Never  Vaping Use   Vaping Use: Never used  Substance and Sexual Activity   Alcohol use: Not Currently   Drug use: Not Currently   Sexual activity: Not on file  Other Topics Concern   Not on file  Social History Narrative   Not on file   Social Determinants of Health   Financial Resource Strain: Not on file  Food Insecurity: Not on file  Transportation Needs: Not on file  Physical Activity: Not on file  Stress: Not on file  Social Connections: Not on file  Intimate Partner Violence: Not on file    Past Surgical History:  Procedure Laterality Date   AV FISTULA PLACEMENT Right    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 Takes on Tues, Thurs,Sat, Disp: , Rfl:    insulin aspart (NOVOLOG) 100 UNIT/ML injection, Inject 8 Units into the skin 3 (three) times daily before meals., Disp: , Rfl:    insulin detemir (LEVEMIR) 100 UNIT/ML injection, Inject 0.08 mLs (8 Units total) into the skin at bedtime., Disp: 10 mL, Rfl: 11   lidocaine-prilocaine (EMLA) cream, Apply 1 application topically daily. Takes on Tues, Thurs, Sat, Disp: , Rfl:    midodrine (PROAMATINE) 10 MG tablet, Take 1 tablet (10 mg total) by mouth every Monday, Wednesday, and Friday with hemodialysis., Disp: 60 tablet, Rfl: 1   pantoprazole  (PROTONIX) 40 MG tablet, Take 40 mg by mouth daily., Disp: , Rfl:    rosuvastatin (CRESTOR) 20 MG tablet, Take 20 mg by mouth every 7 (seven) days., Disp: , Rfl:    sevelamer carbonate (RENVELA) 800 MG tablet, Take 4 tablets by mouth 3 (three) times daily., Disp: , Rfl:    warfarin (COUMADIN) 7.5 MG tablet, Take 3.5-7.5 mg by mouth daily. 3.5 mg daily T,R,S,S 7.5 mg daily M,W,F, Disp: , Rfl:     Physical Exam: Blood pressure 112/84, pulse 78, height 5\' 4"  (1.626 m), weight 249 lb (112.9 kg), SpO2 95 %.    Affect appropriate Obese black female HEENT: blind  Neck supple with no adenopathy JVP normal no bruits no thyromegaly Lungs clear with no wheezing and good diaphragmatic motion Heart:  S1/S2 no murmur, no rub, gallop or click PMI normal Abdomen: benighn, BS positve, no tenderness, no AAA no bruit.  No HSM or HJR RLE amputation  No edema Neuro non-focal Skin warm and dry No muscular weakness Fistula for dialysis both UE;s right functional    Labs:   Lab Results  Component Value Date   WBC 11.9 (H) 07/04/2021   HGB 11.3 (L) 07/04/2021   HCT 33.7 (L) 07/04/2021   MCV 84.7 07/04/2021   PLT 268 07/04/2021   No results for input(s): NA, K, CL, CO2, BUN, CREATININE, CALCIUM, PROT, BILITOT, ALKPHOS, ALT, AST, GLUCOSE in the last 168 hours.  Invalid input(s): LABALBU No results found for: CKTOTAL, CKMB, CKMBINDEX, TROPONINI No results found for: CHOL No results found for: HDL No results found for: LDLCALC No results found for: TRIG No results found for: CHOLHDL No results found for: LDLDIRECT    Radiology: No results found.  EKG: 10/17/20 SR LBBB    ASSESSMENT AND PLAN:   Non ischemic dilated cardiomyopathy:  clean cath in 2018  low risk non ischemic myovue 10/19/20  EF 30-35% Volume controlled with dialysis Medical Rx limited by low BP with dialysis patient is on midodrine   DVT:  history of on coumadin INR  with renal  CRF:  Dialysis with [facility] in [place] Will likely  need vascular f/u given LE issues and problems with access / fistula in past  DM:  on insulin f/u primary A1c 7.6 57/3/22   F/U in a year   Signed: Jenkins Rouge 09/18/2021, 1:22 PM

## 2021-09-18 ENCOUNTER — Ambulatory Visit (INDEPENDENT_AMBULATORY_CARE_PROVIDER_SITE_OTHER): Payer: Medicaid Other | Admitting: Cardiovascular Disease

## 2021-09-18 ENCOUNTER — Encounter: Payer: Self-pay | Admitting: Cardiovascular Disease

## 2021-09-18 VITALS — BP 112/84 | HR 78 | Ht 64.0 in | Wt 249.0 lb

## 2021-09-18 DIAGNOSIS — I132 Hypertensive heart and chronic kidney disease with heart failure and with stage 5 chronic kidney disease, or end stage renal disease: Secondary | ICD-10-CM

## 2021-09-18 DIAGNOSIS — Z7901 Long term (current) use of anticoagulants: Secondary | ICD-10-CM | POA: Diagnosis not present

## 2021-09-18 DIAGNOSIS — I420 Dilated cardiomyopathy: Secondary | ICD-10-CM | POA: Diagnosis not present

## 2021-09-18 DIAGNOSIS — Z5181 Encounter for therapeutic drug level monitoring: Secondary | ICD-10-CM | POA: Diagnosis not present

## 2021-09-18 NOTE — Patient Instructions (Signed)
Medication Instructions:  ?Your physician recommends that you continue on your current medications as directed. Please refer to the Current Medication list given to you today. ? ?*If you need a refill on your cardiac medications before your next appointment, please call your pharmacy* ? ? ?Lab Work: ?NONE  ? ?If you have labs (blood work) drawn today and your tests are completely normal, you will receive your results only by: ?MyChart Message (if you have MyChart) OR ?A paper copy in the mail ?If you have any lab test that is abnormal or we need to change your treatment, we will call you to review the results. ? ? ?Testing/Procedures: ?NONE  ? ? ?Follow-Up: ?At [facility], you and your health needs are our priority.  As part of our continuing mission to provide you with exceptional heart care, we have created designated Provider Care Teams.  These Care Teams include your primary Cardiologist (physician) and Advanced Practice Providers (APPs -  Physician Assistants and Nurse Practitioners) who all work together to provide you with the care you need, when you need it. ? ?We recommend signing up for the patient portal called "MyChart".  Sign up information is provided on this After Visit Summary.  MyChart is used to connect with patients for Virtual Visits (Telemedicine).  Patients are able to view lab/test results, encounter notes, upcoming appointments, etc.  Non-urgent messages can be sent to your provider as well.   ?To learn more about what you can do with MyChart, go to [email or web address].   ? ?Your next appointment:   ?1 year(s) ? ?The format for your next appointment:   ?In Person ? ?Provider:   ?Peter Nishan, MD  ? ? ?Other Instructions ?Thank you for choosing [facility]! ? ? ? ?Important Information About Sugar ? ? ? ? ? ? ?

## 2021-11-26 ENCOUNTER — Emergency Department (HOSPITAL_COMMUNITY)
Admission: EM | Admit: 2021-11-26 | Discharge: 2021-11-26 | Disposition: A | Discharge status: Home / Self Care | Payer: Medicare (Managed Care) | Attending: Emergency Medicine | Admitting: Emergency Medicine

## 2021-11-26 ENCOUNTER — Emergency Department (HOSPITAL_COMMUNITY): Payer: Medicare (Managed Care)

## 2021-11-26 ENCOUNTER — Other Ambulatory Visit: Payer: Self-pay

## 2021-11-26 ENCOUNTER — Encounter (HOSPITAL_COMMUNITY): Payer: Self-pay | Admitting: Emergency Medicine

## 2021-11-26 DIAGNOSIS — J45909 Unspecified asthma, uncomplicated: Secondary | ICD-10-CM | POA: Diagnosis not present

## 2021-11-26 DIAGNOSIS — I509 Heart failure, unspecified: Secondary | ICD-10-CM | POA: Diagnosis not present

## 2021-11-26 DIAGNOSIS — E669 Obesity, unspecified: Secondary | ICD-10-CM | POA: Diagnosis not present

## 2021-11-26 DIAGNOSIS — R5383 Other fatigue: Secondary | ICD-10-CM | POA: Diagnosis not present

## 2021-11-26 DIAGNOSIS — Z794 Long term (current) use of insulin: Secondary | ICD-10-CM | POA: Insufficient documentation

## 2021-11-26 DIAGNOSIS — R079 Chest pain, unspecified: Secondary | ICD-10-CM | POA: Insufficient documentation

## 2021-11-26 DIAGNOSIS — D649 Anemia, unspecified: Secondary | ICD-10-CM | POA: Diagnosis not present

## 2021-11-26 DIAGNOSIS — Z79899 Other long term (current) drug therapy: Secondary | ICD-10-CM | POA: Insufficient documentation

## 2021-11-26 DIAGNOSIS — Z992 Dependence on renal dialysis: Secondary | ICD-10-CM | POA: Diagnosis not present

## 2021-11-26 DIAGNOSIS — Z7901 Long term (current) use of anticoagulants: Secondary | ICD-10-CM | POA: Diagnosis not present

## 2021-11-26 DIAGNOSIS — D72829 Elevated white blood cell count, unspecified: Secondary | ICD-10-CM | POA: Insufficient documentation

## 2021-11-26 DIAGNOSIS — N186 End stage renal disease: Secondary | ICD-10-CM | POA: Diagnosis not present

## 2021-11-26 DIAGNOSIS — E1122 Type 2 diabetes mellitus with diabetic chronic kidney disease: Secondary | ICD-10-CM | POA: Diagnosis not present

## 2021-11-26 DIAGNOSIS — Z8673 Personal history of transient ischemic attack (TIA), and cerebral infarction without residual deficits: Secondary | ICD-10-CM | POA: Insufficient documentation

## 2021-11-26 DIAGNOSIS — Z6841 Body Mass Index (BMI) 40.0 and over, adult: Secondary | ICD-10-CM | POA: Diagnosis not present

## 2021-11-26 DIAGNOSIS — I132 Hypertensive heart and chronic kidney disease with heart failure and with stage 5 chronic kidney disease, or end stage renal disease: Secondary | ICD-10-CM | POA: Insufficient documentation

## 2021-11-26 LAB — BASIC METABOLIC PANEL
Anion gap: 15 (ref 5–15)
BUN: 84 mg/dL — ABNORMAL HIGH (ref 6–20)
CO2: 23 mmol/L (ref 22–32)
Calcium: 7.9 mg/dL — ABNORMAL LOW (ref 8.9–10.3)
Chloride: 96 mmol/L — ABNORMAL LOW (ref 98–111)
Creatinine, Ser: 11.18 mg/dL — ABNORMAL HIGH (ref 0.44–1.00)
GFR, Estimated: 4 mL/min — ABNORMAL LOW (ref >60)
Glucose, Bld: 186 mg/dL — ABNORMAL HIGH (ref 70–99)
Potassium: 5 mmol/L (ref 3.5–5.1)
Sodium: 134 mmol/L — ABNORMAL LOW (ref 135–145)

## 2021-11-26 LAB — CBC
HCT: 34.5 % — ABNORMAL LOW (ref 36.0–46.0)
Hemoglobin: 11.8 g/dL — ABNORMAL LOW (ref 12.0–15.0)
MCH: 27.9 pg (ref 26.0–34.0)
MCHC: 34.2 g/dL (ref 30.0–36.0)
MCV: 81.6 fL (ref 80.0–100.0)
Platelets: 261 10*3/uL (ref 150–400)
RBC: 4.23 MIL/uL (ref 3.87–5.11)
RDW: 13.8 % (ref 11.5–15.5)
WBC: 12 10*3/uL — ABNORMAL HIGH (ref 4.0–10.5)
nRBC: 0 % (ref 0.0–0.2)

## 2021-11-26 LAB — TROPONIN I (HIGH SENSITIVITY)
Troponin I (High Sensitivity): 13 ng/L (ref <18)
Troponin I (High Sensitivity): 13 ng/L (ref <18)

## 2021-11-26 NOTE — Discharge Instructions (Signed)
There is a telephone number below that you can call to establish care with cardiology.  Return to the emergency department for any new or worsening symptoms of concern.

## 2021-11-26 NOTE — ED Triage Notes (Signed)
Pt presents with chest pain, shortly after having dialysis ([facility]), with neck pain, right side pain.

## 2021-11-26 NOTE — ED Notes (Signed)
Went over [employer]. Wheeled out to lobby with husband. All questions answered.

## 2021-11-26 NOTE — ED Provider Notes (Signed)
[facility] EMERGENCY DEPARTMENT Provider Note   CSN: [id number] Arrival date & time: 11/26/21  1712     History {Add pertinent medical, surgical, social history, OB history to HPI:1} Chief Complaint  Patient presents with   Chest Pain    Stacey Harrell is a 56 y.o. female.   Chest Pain Patient presents for chest pain.  Medical history includes T2DM, CVA, CHF, ESRD (on HD), HTN, HLD, obesity, and asthma.  Earlier today, she was at her dialysis.  She goes on M, W, and F.  While at dialysis, she informed the staff that she was having chest pain.  Chest pain lasted for approximately 30 minutes.  At maximal, it was 4/10 in severity.  Patient received nearly a full session of dialysis.  She states that she was taken off 20 minutes prior to the end of her session.  Staff encouraged her to go to the ED.  Currently, patient endorses fatigue but denies any other current symptoms.  She states that her fatigue is characteristic of her postdialysis symptoms.  She does not currently see a cardiologist.     Home Medications Prior to Admission medications   Medication Sig Start Date End Date Taking? Authorizing Provider  albuterol (PROVENTIL) (5 MG/ML) 0.5% nebulizer solution Take 2.5 mg by nebulization every 6 (six) hours as needed for wheezing or shortness of breath.    [provider]  albuterol (VENTOLIN HFA) 108 (90 Base) MCG/ACT inhaler Inhale 2 puffs into the lungs every 4 (four) hours as needed for wheezing or shortness of breath.    [provider]  Cholecalciferol 25 MCG (1000 UT) tablet Take 1,000 Units by mouth daily. Takes on Tues, Thurs,Sat    [provider]  insulin aspart (NOVOLOG) 100 UNIT/ML injection Inject 8 Units into the skin 3 (three) times daily before meals.    [provider]  insulin detemir (LEVEMIR) 100 UNIT/ML injection Inject 0.08 mLs (8 Units total) into the skin at bedtime. 10/18/20   Johnson, Clanford L, MD  lidocaine-prilocaine  (EMLA) cream Apply 1 application topically daily. Takes on Tues, Thurs, Sat    [provider]  midodrine (PROAMATINE) 10 MG tablet Take 1 tablet (10 mg total) by mouth every Monday, Wednesday, and Friday with hemodialysis. 10/18/20   Orson Eva, MD  pantoprazole (PROTONIX) 40 MG tablet Take 40 mg by mouth daily. 08/24/20   [provider]  rosuvastatin (CRESTOR) 20 MG tablet Take 20 mg by mouth every 7 (seven) days. 07/08/19   [provider]  sevelamer carbonate (RENVELA) 800 MG tablet Take 4 tablets by mouth 3 (three) times daily. 09/25/20   [provider]  warfarin (COUMADIN) 7.5 MG tablet Take 3.5-7.5 mg by mouth daily. 3.5 mg daily T,R,S,S 7.5 mg daily M,W,F    [provider]      Allergies    Iodinated contrast media, Shellfish allergy, Dulaglutide, Gabapentin, and Lorazepam    Review of Systems   Review of Systems  Cardiovascular:  Positive for chest pain.  All other systems reviewed and are negative.   Physical Exam Updated Vital Signs BP (!) 105/34   Pulse 81   Temp 98 F (36.7 C) (Oral)   Resp 16   Ht 5\' 4"  (1.626 m)   Wt 112.9 kg   SpO2 98%   BMI 42.72 kg/m  Physical Exam Vitals and nursing note reviewed.  Constitutional:      General: She is not in acute distress.    Appearance: She is  well-developed. She is not toxic-appearing or diaphoretic.  HENT:     Head: Normocephalic and atraumatic.  Eyes:     Conjunctiva/sclera: Conjunctivae normal.     Comments: Blindness at baseline  Cardiovascular:     Rate and Rhythm: Normal rate and regular rhythm.     Heart sounds: No murmur heard.    Gallop present.  Pulmonary:     Effort: Pulmonary effort is normal. No respiratory distress.     Breath sounds: Normal breath sounds. No decreased breath sounds, wheezing, rhonchi or rales.  Abdominal:     Palpations: Abdomen is soft.     Tenderness: There is no abdominal tenderness.  Musculoskeletal:        General: No swelling.      Cervical back: Normal range of motion and neck supple.  Skin:    General: Skin is warm and dry.  Neurological:     General: No focal deficit present.     Mental Status: She is alert and oriented to person, place, and time.  Psychiatric:        Mood and Affect: Mood normal. Affect is flat.        Speech: Speech normal.        Behavior: Behavior normal. Behavior is cooperative.     ED Results / Procedures / Treatments   Labs (all labs ordered are listed, but only abnormal results are displayed) Labs Reviewed  BASIC METABOLIC PANEL - Abnormal; Notable for the following components:      Result Value   Sodium 134 (*)    Chloride 96 (*)    Glucose, Bld 186 (*)    BUN 84 (*)    Creatinine, Ser 11.18 (*)    Calcium 7.9 (*)    GFR, Estimated 4 (*)    All other components within normal limits  CBC - Abnormal; Notable for the following components:   WBC 12.0 (*)    Hemoglobin 11.8 (*)    HCT 34.5 (*)    All other components within normal limits  TROPONIN I (HIGH SENSITIVITY)  TROPONIN I (HIGH SENSITIVITY)    EKG None  Radiology DG Chest 2 View  Result Date: 11/26/2021 CLINICAL DATA:  cp EXAM: CHEST - 2 VIEW COMPARISON:  Chest x-ray 07/04/2021 FINDINGS: Right brachiocephalic vascular stent again noted. Similar-appearing cardiomegaly. The heart and mediastinal contours are unchanged. Slightly prominent hilar vasculature. No focal consolidation. Slightly increased markings. No pleural effusion. No pneumothorax. No acute osseous abnormality. IMPRESSION: Cardiomegaly with mild pulmonary edema. Electronically Signed   By: Iven Finn M.D.   On: 11/26/2021 17:55    Procedures Procedures  {Document cardiac monitor, telemetry assessment procedure when appropriate:1}  Medications Ordered in ED Medications - No data to display  ED Course/ Medical Decision Making/ A&P                           Medical Decision Making Amount and/or Complexity of Data Reviewed Labs:  ordered. Radiology: ordered.   ***  {Document critical care time when appropriate:1} {Document review of labs and clinical decision tools ie heart score, Chads2Vasc2 etc:1}  {Document your independent review of radiology images, and any outside records:1} {Document your discussion with family members, caretakers, and with consultants:1} {Document social determinants of health affecting pt's care:1} {Document your decision making why or why not admission, treatments were needed:1} Final Clinical Impression(s) / ED Diagnoses Final diagnoses:  None    Rx / DC Orders ED Discharge Orders  None       

## 2022-08-26 IMAGING — DX DG CHEST 2V
2 series · 2 of 2 positions shown · non-contrast
Comparison: None.

CLINICAL DATA: Chest pain and shortness of breath.

EXAM:
CHEST - 2 VIEW

[chest lat]
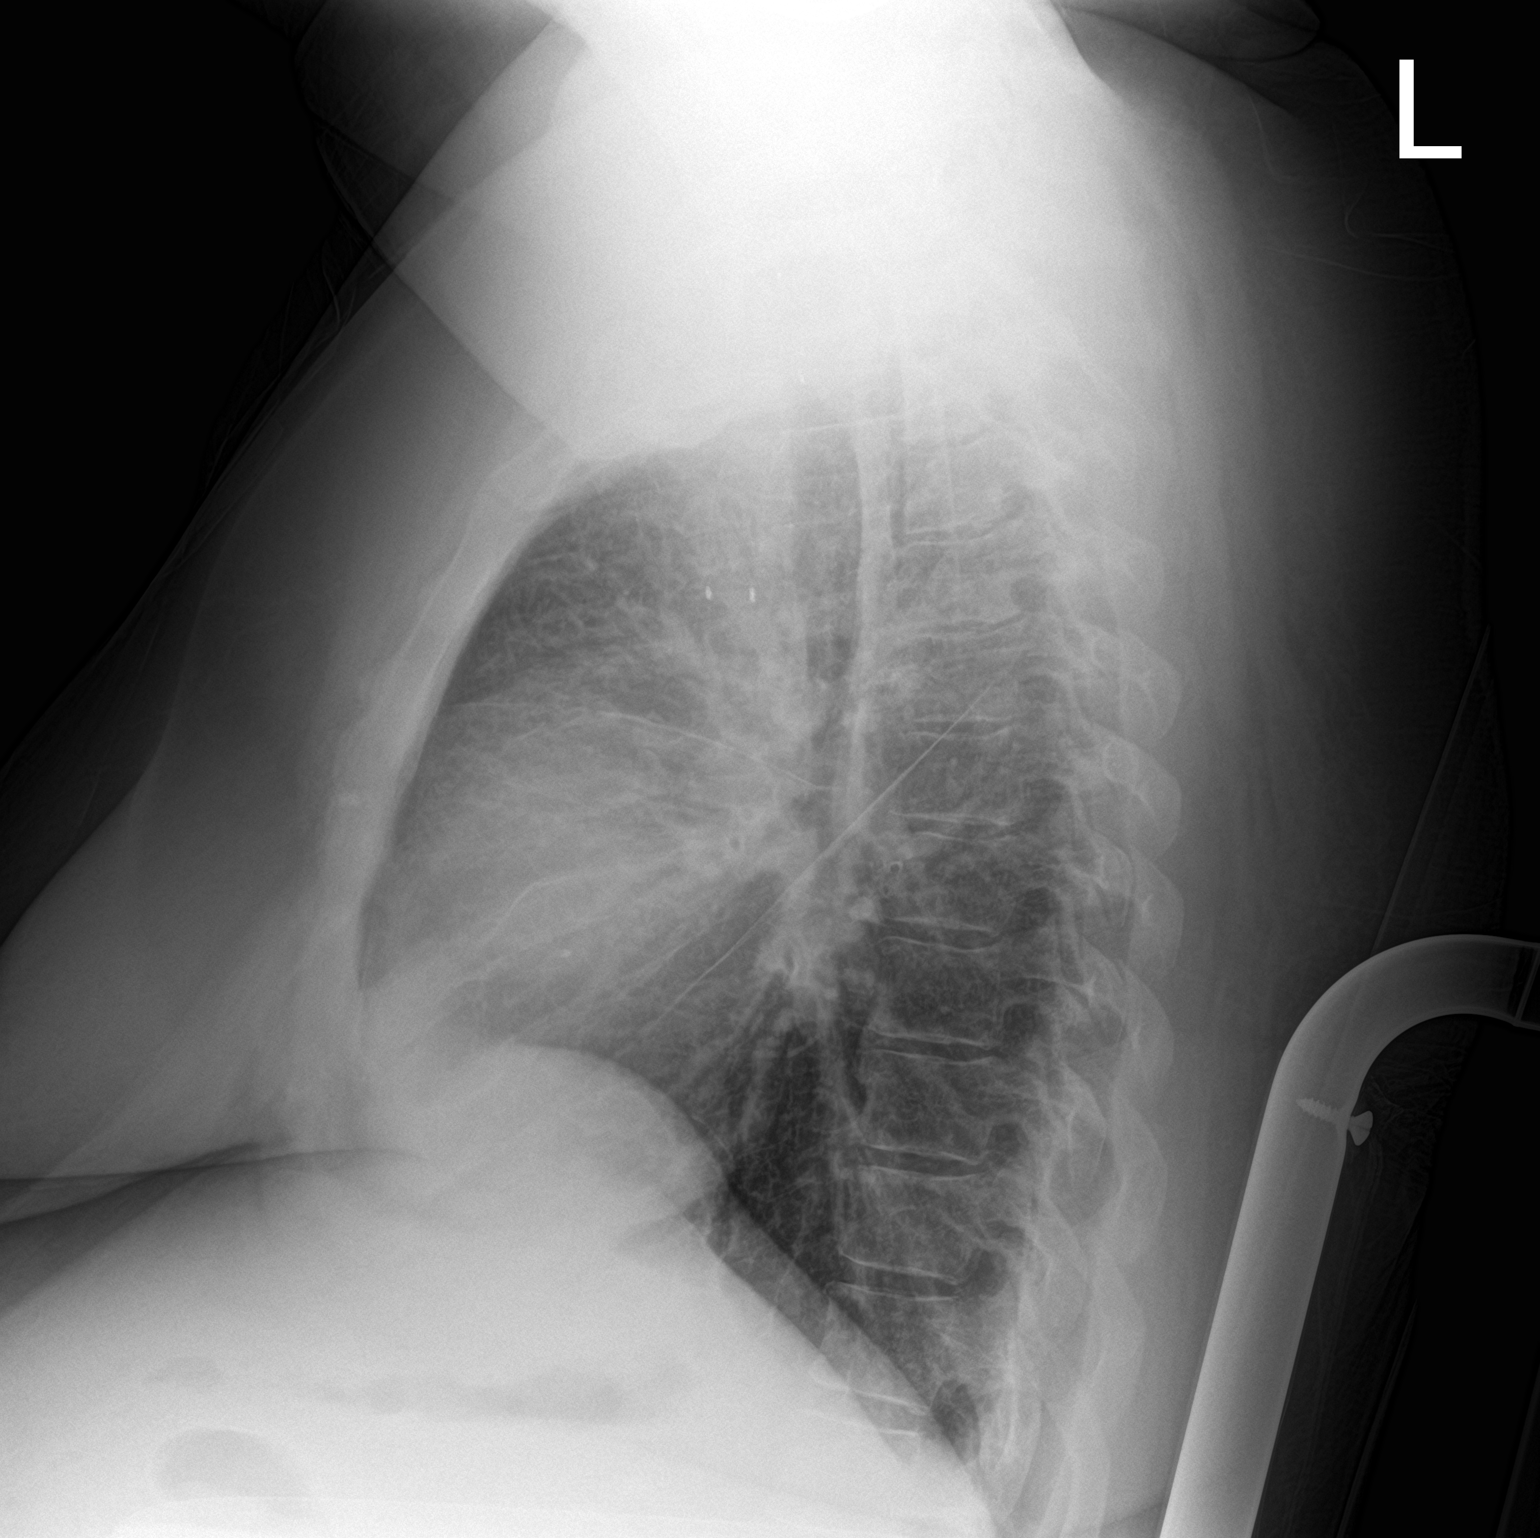

[chest ap]
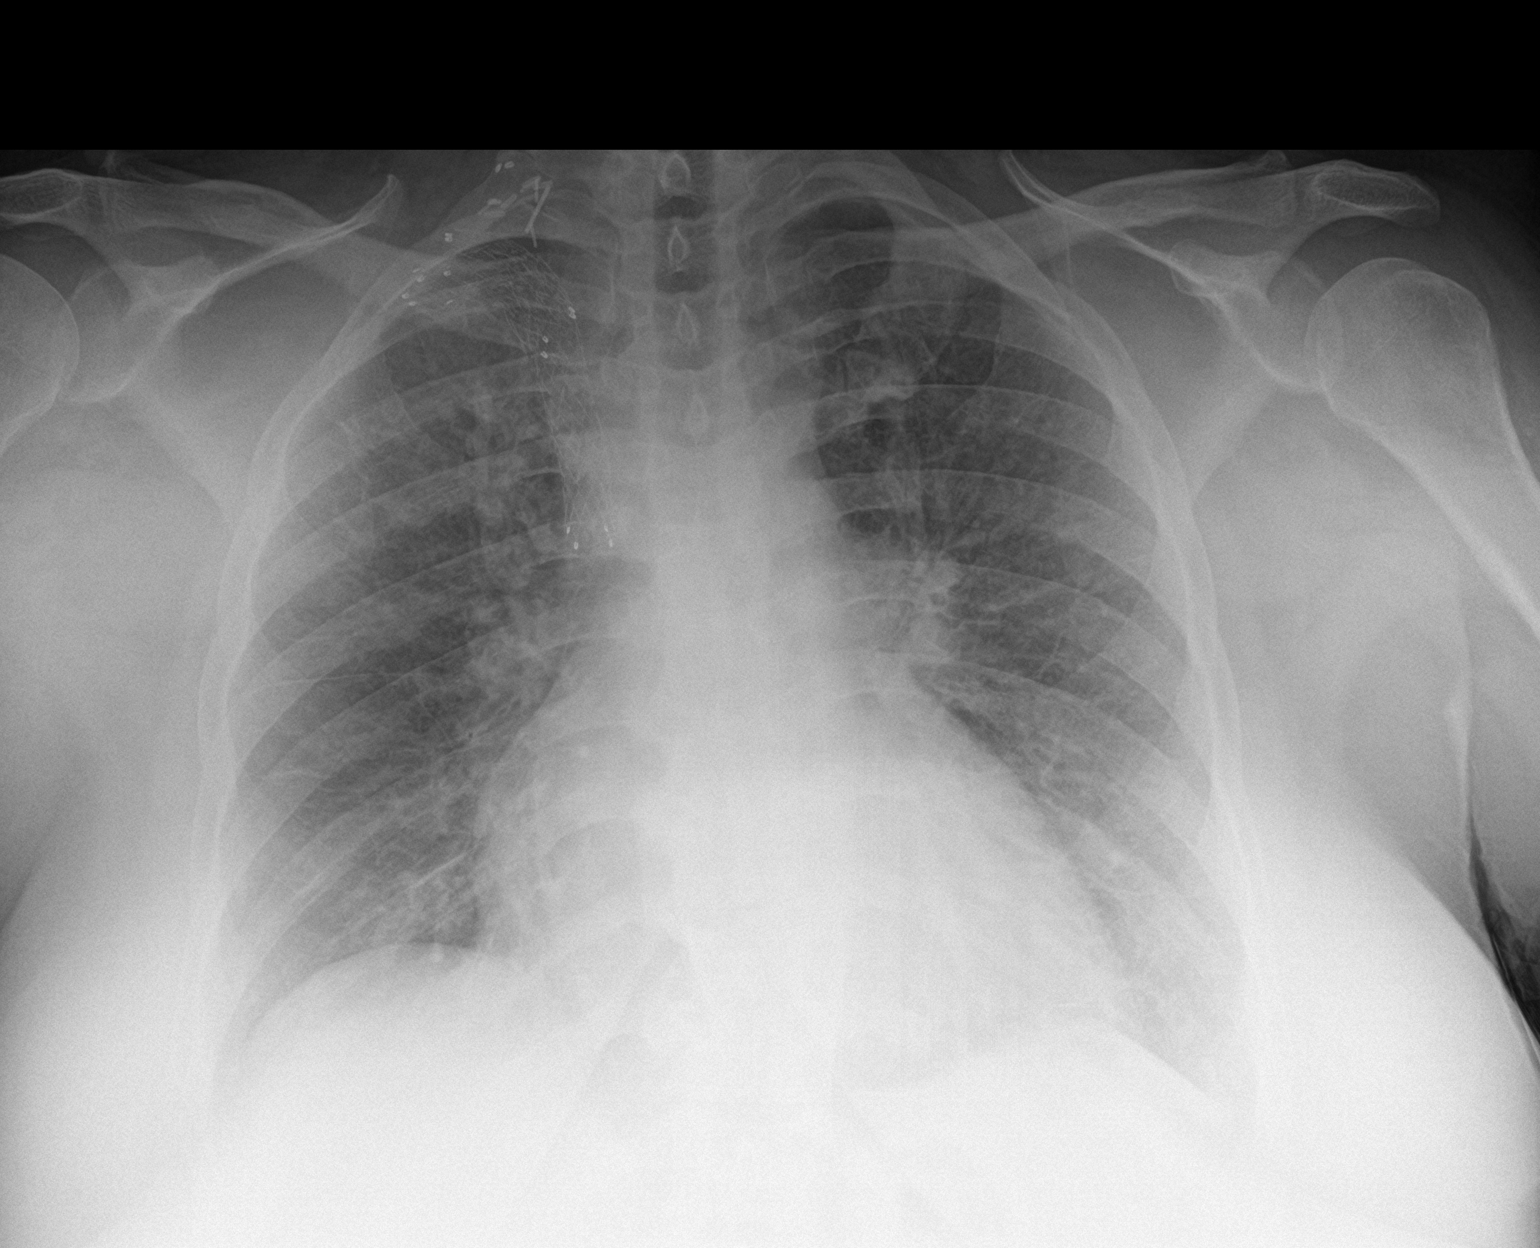

[2 of 2 positions shown; findings below may reference images not displayed]

FINDINGS: The heart is enlarged. There is a vascular stent to the right of the
mediastinum. Surgical clips adjacent to the right lung apex.
Peribronchial thickening may be bronchitic or congestive minimal
fluid in the fissures without significant sub pulmonic effusion. No
confluent consolidation. No pneumothorax. No acute osseous
abnormalities are seen.
IMPRESSION: 1. Cardiomegaly.
2. Peribronchial thickening may be bronchitic or congestive. Fluid
in the fissures. Findings suggest fluid overload.

## 2023-07-04 ENCOUNTER — Telehealth: Payer: Self-pay | Admitting: Cardiovascular Disease

## 2023-07-04 NOTE — Telephone Encounter (Signed)
 Called Stacey Harrell to schedule recall for Dr. Eden Emms- Stacey Harrell did not wish to sch apt  Recall removed

## 2023-07-20 LAB — COLOGUARD: COLOGUARD: NEGATIVE
